# Patient Record
Sex: Male | Born: 1950 | Race: White | Hispanic: No | Marital: Married | Smoking: Current every day smoker
Health system: Southern US, Community
[De-identification: ages and names within clinical notes are randomized; demographics above are authoritative.]

---

## 2000-02-15 ENCOUNTER — Encounter: Admission: RE | Admit: 2000-02-15 | Discharge: 2000-02-15 | Payer: Self-pay | Admitting: Rheumatology

## 2000-02-15 ENCOUNTER — Encounter: Payer: Self-pay | Admitting: Rheumatology

## 2003-02-17 ENCOUNTER — Encounter: Admission: RE | Admit: 2003-02-17 | Discharge: 2003-02-17 | Payer: Self-pay | Admitting: Rheumatology

## 2003-02-27 ENCOUNTER — Encounter: Admission: RE | Admit: 2003-02-27 | Discharge: 2003-02-27 | Payer: Self-pay | Admitting: Rheumatology

## 2003-06-15 ENCOUNTER — Encounter: Admission: RE | Admit: 2003-06-15 | Discharge: 2003-06-15 | Payer: Self-pay | Admitting: Rheumatology

## 2003-06-29 ENCOUNTER — Encounter: Admission: RE | Admit: 2003-06-29 | Discharge: 2003-06-29 | Payer: Self-pay | Admitting: Rheumatology

## 2003-07-20 ENCOUNTER — Encounter: Admission: RE | Admit: 2003-07-20 | Discharge: 2003-07-20 | Payer: Self-pay | Admitting: Rheumatology

## 2006-04-23 ENCOUNTER — Encounter: Admission: RE | Admit: 2006-04-23 | Discharge: 2006-04-23 | Payer: Self-pay | Admitting: Rheumatology

## 2015-10-25 ENCOUNTER — Other Ambulatory Visit: Payer: Self-pay | Admitting: Rheumatology

## 2015-10-25 ENCOUNTER — Ambulatory Visit
Admission: RE | Admit: 2015-10-25 | Discharge: 2015-10-25 | Disposition: A | Discharge status: Home / Self Care | Payer: 59 | Source: Ambulatory Visit | Attending: Rheumatology | Admitting: Rheumatology

## 2015-10-25 DIAGNOSIS — R1032 Left lower quadrant pain: Secondary | ICD-10-CM

## 2017-04-03 DIAGNOSIS — M1612 Unilateral primary osteoarthritis, left hip: Secondary | ICD-10-CM | POA: Insufficient documentation

## 2017-04-03 DIAGNOSIS — M459 Ankylosing spondylitis of unspecified sites in spine: Secondary | ICD-10-CM | POA: Insufficient documentation

## 2017-04-03 DIAGNOSIS — E785 Hyperlipidemia, unspecified: Secondary | ICD-10-CM | POA: Insufficient documentation

## 2018-10-17 ENCOUNTER — Encounter (HOSPITAL_COMMUNITY): Payer: Self-pay

## 2018-10-17 ENCOUNTER — Inpatient Hospital Stay (HOSPITAL_COMMUNITY)
Admission: EM | Admit: 2018-10-17 | Discharge: 2018-10-23 | DRG: 455 | Disposition: A | Discharge status: Home Health | Payer: Medicare Other | Attending: Neurological Surgery | Admitting: Neurological Surgery

## 2018-10-17 ENCOUNTER — Other Ambulatory Visit: Payer: Self-pay

## 2018-10-17 ENCOUNTER — Emergency Department (HOSPITAL_COMMUNITY): Payer: Medicare Other

## 2018-10-17 DIAGNOSIS — M542 Cervicalgia: Secondary | ICD-10-CM

## 2018-10-17 DIAGNOSIS — W11XXXA Fall on and from ladder, initial encounter: Secondary | ICD-10-CM | POA: Diagnosis present

## 2018-10-17 DIAGNOSIS — S12590A Other displaced fracture of sixth cervical vertebra, initial encounter for closed fracture: Secondary | ICD-10-CM | POA: Diagnosis not present

## 2018-10-17 DIAGNOSIS — M452 Ankylosing spondylitis of cervical region: Secondary | ICD-10-CM | POA: Diagnosis present

## 2018-10-17 DIAGNOSIS — R1310 Dysphagia, unspecified: Secondary | ICD-10-CM | POA: Diagnosis not present

## 2018-10-17 DIAGNOSIS — Z20828 Contact with and (suspected) exposure to other viral communicable diseases: Secondary | ICD-10-CM | POA: Diagnosis present

## 2018-10-17 DIAGNOSIS — R292 Abnormal reflex: Secondary | ICD-10-CM | POA: Diagnosis present

## 2018-10-17 DIAGNOSIS — Z419 Encounter for procedure for purposes other than remedying health state, unspecified: Secondary | ICD-10-CM

## 2018-10-17 DIAGNOSIS — Z7951 Long term (current) use of inhaled steroids: Secondary | ICD-10-CM

## 2018-10-17 DIAGNOSIS — Z79899 Other long term (current) drug therapy: Secondary | ICD-10-CM

## 2018-10-17 DIAGNOSIS — S129XXA Fracture of neck, unspecified, initial encounter: Secondary | ICD-10-CM | POA: Diagnosis present

## 2018-10-17 DIAGNOSIS — F17210 Nicotine dependence, cigarettes, uncomplicated: Secondary | ICD-10-CM | POA: Diagnosis present

## 2018-10-17 LAB — CBC WITH DIFFERENTIAL/PLATELET
Abs Immature Granulocytes: 0.08 10*3/uL — ABNORMAL HIGH (ref 0.00–0.07)
Basophils Absolute: 0 10*3/uL (ref 0.0–0.1)
Basophils Relative: 0 %
Eosinophils Absolute: 0 10*3/uL (ref 0.0–0.5)
Eosinophils Relative: 0 %
HCT: 43.5 % (ref 39.0–52.0)
Hemoglobin: 14.7 g/dL (ref 13.0–17.0)
Immature Granulocytes: 1 %
Lymphocytes Relative: 8 %
Lymphs Abs: 0.9 10*3/uL (ref 0.7–4.0)
MCH: 29.6 pg (ref 26.0–34.0)
MCHC: 33.8 g/dL (ref 30.0–36.0)
MCV: 87.5 fL (ref 80.0–100.0)
Monocytes Absolute: 0.9 10*3/uL (ref 0.1–1.0)
Monocytes Relative: 8 %
Neutro Abs: 9.2 10*3/uL — ABNORMAL HIGH (ref 1.7–7.7)
Neutrophils Relative %: 83 %
Platelets: 198 10*3/uL (ref 150–400)
RBC: 4.97 MIL/uL (ref 4.22–5.81)
RDW: 13.1 % (ref 11.5–15.5)
WBC: 11.1 10*3/uL — ABNORMAL HIGH (ref 4.0–10.5)
nRBC: 0 % (ref 0.0–0.2)

## 2018-10-17 LAB — BASIC METABOLIC PANEL
Anion gap: 9 (ref 5–15)
BUN: 15 mg/dL (ref 8–23)
CO2: 26 mmol/L (ref 22–32)
Calcium: 9.3 mg/dL (ref 8.9–10.3)
Chloride: 101 mmol/L (ref 98–111)
Creatinine, Ser: 0.81 mg/dL (ref 0.61–1.24)
GFR calc Af Amer: >60 mL/min (ref >60)
GFR calc non Af Amer: >60 mL/min (ref >60)
Glucose, Bld: 107 mg/dL — ABNORMAL HIGH (ref 70–99)
Potassium: 4 mmol/L (ref 3.5–5.1)
Sodium: 136 mmol/L (ref 135–145)

## 2018-10-17 MED ORDER — HYDROMORPHONE HCL 1 MG/ML IJ SOLN
0.5000 mg | Freq: Once | INTRAMUSCULAR | Status: AC
  Administered 2018-10-17: 0.5 mg via INTRAVENOUS
  Filled 2018-10-17: qty 1

## 2018-10-17 MED ORDER — MORPHINE SULFATE (PF) 4 MG/ML IV SOLN
4.0000 mg | Freq: Once | INTRAVENOUS | Status: AC
  Administered 2018-10-17: 16:00:00 4 mg via INTRAVENOUS
  Filled 2018-10-17: qty 1

## 2018-10-17 MED ORDER — ONDANSETRON HCL 4 MG/2ML IJ SOLN
4.0000 mg | Freq: Once | INTRAMUSCULAR | Status: AC
  Administered 2018-10-17: 4 mg via INTRAVENOUS
  Filled 2018-10-17: qty 2

## 2018-10-17 MED ORDER — LORAZEPAM 2 MG/ML IJ SOLN
0.5000 mg | Freq: Once | INTRAMUSCULAR | Status: AC
  Administered 2018-10-17: 0.5 mg via INTRAVENOUS
  Filled 2018-10-17: qty 1

## 2018-10-17 NOTE — ED Notes (Signed)
Pt refused MRI, pt offered medication to help relax

## 2018-10-17 NOTE — ED Triage Notes (Signed)
Pt reports he had a fall off of a ladder on Tuesday and was seen in an ED that day. Per patient since then he has had increased pain in his right shoulder/back and has had difficulty walking. Pt reports he can barely get himself to the bathroom. Pt reports he does have a pmh of arthritis.

## 2018-10-17 NOTE — ED Notes (Addendum)
Called MRI to see when pt would be able to obtain scan. Wait time of an hour to an hour and a half. Pt informed of delay

## 2018-10-17 NOTE — ED Notes (Signed)
Pt to MRI at this time.

## 2018-10-17 NOTE — ED Provider Notes (Addendum)
[facility]   CSN: [id number] Arrival date & time: 10/17/18  1449    History   Chief Complaint Chief Complaint  Patient presents with   Fall    HPI Travis Hart is a 68 y.o. male.     68 yo M with a chief complaint of right-sided neck pain.  Patient fell off a ladder about a week ago.  Was seen had a trauma center at a different hospital.  Had a CT scan of the head and the neck that was unremarkable and they recommended an MRI which the patient declined and left.  Since then the pain has gotten much worse.  He has been having trouble getting up and moving around secondary to the pain.  Denies numbness or weakness to the upper or lower extremities.  He has had some bilateral paresthesias to bilateral upper extremities.  Denies chest pain denies shortness of breath denies abdominal pain.  The history is provided by the patient and medical records.  Fall This is a new problem. The current episode started more than 2 days ago. The problem occurs constantly. The problem has been gradually worsening. Pertinent negatives include no chest pain, no abdominal pain, no headaches and no shortness of breath. Nothing aggravates the symptoms. Nothing relieves the symptoms. He has tried nothing for the symptoms. The treatment provided no relief.    History reviewed. No pertinent past medical history.  Patient Active Problem List   Diagnosis Date Noted   Closed cervical spine fracture (HCC) 10/18/2018    Past Surgical History:  Procedure Laterality Date   ANTERIOR CERVICAL DECOMP/DISCECTOMY FUSION N/A 10/18/2018   Procedure: CERVICAL CORPECTOMY CERVICAL SIX;  Surgeon: Jadene Pierinistergard, Thomas A, MD;  Location: [facility] OR;  Service: Neurosurgery;  Laterality: N/A;   POSTERIOR CERVICAL FUSION/FORAMINOTOMY N/A 10/18/2018   Procedure: POSTERIOR CERVICAL FUSION/FORAMINOTOMY  CERVICAL FOUR-THORACIC ONE;  Surgeon: Jadene Pierinistergard, Thomas A, MD;  Location: [facility] OR;   Service: Neurosurgery;  Laterality: N/A;        Home Medications    Prior to Admission medications   Medication Sig Start Date End Date Taking? Authorizing Provider  acetaminophen (TYLENOL) 500 MG tablet Take 1,000 mg by mouth every 6 (six) hours as needed for mild pain.   Yes [provider]  ENBREL 25 MG injection Inject 25 mg into the skin 2 (two) times a week. Monday, Thursday 09/29/18  Yes [provider]  nabumetone (RELAFEN) 750 MG tablet Take 750 mg by mouth 2 (two) times daily. 09/30/18  Yes [provider]  predniSONE (DELTASONE) 20 MG tablet Take 10-20 mg by mouth See admin instructions. Take 1/2 tablet (10mg ) to 1 tablet (20mg ) every day for arthritis flare up 09/15/18  Yes [provider]  simvastatin (ZOCOR) 40 MG tablet Take 40 mg by mouth daily. 09/30/18  Yes [provider]  SPIRIVA HANDIHALER 18 MCG inhalation capsule Place 18 mcg into inhaler and inhale daily as needed for shortness of breath. 08/19/18  Yes [provider]    Family History History reviewed. No pertinent family history.  Social History Social History   Tobacco Use   Smoking status: Current Every Day Smoker   Smokeless tobacco: Never Used  Substance Use Topics   Alcohol use: Not on file   Drug use: Not on file     Allergies   Patient has no known allergies.   Review of Systems Review of Systems  Constitutional: Negative for chills and fever.  HENT:  Negative for congestion and facial swelling.   Eyes: Negative for discharge and visual disturbance.  Respiratory: Negative for shortness of breath.   Cardiovascular: Negative for chest pain and palpitations.  Gastrointestinal: Negative for abdominal pain, diarrhea and vomiting.  Musculoskeletal: Positive for arthralgias, myalgias and neck pain.  Skin: Negative for color change and rash.  Neurological: Negative for tremors, syncope and headaches.  Psychiatric/Behavioral: Negative for  confusion and dysphoric mood.     Physical Exam Updated Vital Signs BP 135/61 (BP Location: Left Arm)    Pulse 72    Temp (!) 97.5 F (36.4 C) (Oral)    Resp 16    Ht 5\' 6"  (1.676 m) Comment: per pt   Wt 90.7 kg Comment: per pt   SpO2 97%    BMI 32.28 kg/m   Physical Exam Vitals signs and nursing note reviewed.  Constitutional:      Appearance: He is well-developed.  HENT:     Head: Normocephalic and atraumatic.     Comments: Aspen collar in place.  No appreciable midline spinal tenderness.  Eyes:     Pupils: Pupils are equal, round, and reactive to light.  Neck:     Musculoskeletal: Normal range of motion and neck supple.     Vascular: No JVD.  Cardiovascular:     Rate and Rhythm: Normal rate and regular rhythm.     Heart sounds: No murmur. No friction rub. No gallop.   Pulmonary:     Effort: No respiratory distress.     Breath sounds: No wheezing.  Abdominal:     General: There is no distension.     Tenderness: There is no guarding or rebound.  Musculoskeletal: Normal range of motion.     Comments: Pulse motor and sensation intact to all 4 extremities.  Patient has 4-5 muscle strength to the right upper extremity compared to left.  4 out of 5 to the right lower extremity compared to left.  No reflexes to the right lower extremity.  Hyperreflexive to the left lower extremity.  2 beats of clonus to the left lower extremity.  Skin:    Coloration: Skin is not pale.     Findings: No rash.  Neurological:     Mental Status: He is alert and oriented to person, place, and time.  Psychiatric:        Behavior: Behavior normal.      ED Treatments / Results  Labs (all labs ordered are listed, but only abnormal results are displayed) Labs Reviewed  CBC WITH DIFFERENTIAL/PLATELET - Abnormal; Notable for the following components:      Result Value   WBC 11.1 (*)    Neutro Abs 9.2 (*)    Abs Immature Granulocytes 0.08 (*)    All other components within normal limits  BASIC  METABOLIC PANEL - Abnormal; Notable for the following components:   Glucose, Bld 107 (*)    All other components within normal limits  CBC - Abnormal; Notable for the following components:   WBC 12.3 (*)    RBC 4.12 (*)    Hemoglobin 12.1 (*)    HCT 36.2 (*)    All other components within normal limits  GLUCOSE, CAPILLARY - Abnormal; Notable for the following components:   Glucose-Capillary 104 (*)    All other components within normal limits  POCT I-STAT 4, (NA,K, GLUC, HGB,HCT) - Abnormal; Notable for the following components:   Glucose, Bld 130 (*)    HCT 37.0 (*)    Hemoglobin  12.6 (*)    All other components within normal limits  SARS CORONAVIRUS 2 (HOSPITAL ORDER, PERFORMED IN [facility] LAB)  CREATININE, SERUM  HEMOGLOBIN A1C    EKG None  Radiology Dg Swallowing Func-speech Pathology  Result Date: 10/21/2018 Objective Swallowing Evaluation: Type of Study: MBS-Modified Barium Swallow Study  Patient Details Name: AVANISH CERULLO MRN: [id number] Date of Birth: [birth year] Today's Date: 10/21/2018 Time: SLP Start Time (ACUTE ONLY): 0865 -SLP Stop Time (ACUTE ONLY): 0948 SLP Time Calculation (min) (ACUTE ONLY): 20 min Past Medical History: No past medical history on file. Past Surgical History: Past Surgical History: Procedure Laterality Date  ANTERIOR CERVICAL DECOMP/DISCECTOMY FUSION N/A 10/18/2018  Procedure: CERVICAL CORPECTOMY CERVICAL SIX;  Surgeon: Jadene Pierini, MD;  Location: [facility] OR;  Service: Neurosurgery;  Laterality: N/A;  POSTERIOR CERVICAL FUSION/FORAMINOTOMY N/A 10/18/2018  Procedure: POSTERIOR CERVICAL FUSION/FORAMINOTOMY  CERVICAL FOUR-THORACIC ONE;  Surgeon: Jadene Pierini, MD;  Location: [facility] OR;  Service: Neurosurgery;  Laterality: N/A; HPI:  68 y.o. man s/p fall with neck pain and symptoms of instability with hand and numbness. CT C-spine confirmed fracture line through C6 vertebral body into the C5-6 disc space with fracture of the C6-7 posterior  elements. Pt s/p C5-6 ACDF followed by C4-T1 posterior instrumented fusion on 7/5. PMH: arthritis, ankylosing spondylitis, freq hip dislocations.  Subjective: pt isn't sure if his swallowing is any different today Assessment / Plan / Recommendation CHL IP CLINICAL IMPRESSIONS 10/21/2018 Clinical Impression Pt has a mild-moderate pharyngeal dysphagia from what appears to be edema from recent surgery that results in protrusion of the posterior pharyngeal wall, limiting epiglottic inversion. With incomplete airway closure, pt does have consistent entrance into the laryngeal vestibule regardless of consistency attempted; however, thin liquids penetrate transiently, clearing either upon completion of the swallow or as he performs subsequent swallows. There was a single incidence of aspiration with a larger sip, but it was trace and pt had an immediate cough response that ejected it out of the airway and then up and out his oral cavity. Penetration with solids does not clear quite as promptly, although it also does not reach the vocal folds. He has increased vallecular residue as boluses become more solid. Given potential limitations in positioning when pt is eating meals, recommend starting with a full liquid diet today, using small, single boluses to reduce risk of aspiration. Instructed pt to monitor for any coughing, which is likely a sign that he is either getting too large of sips or that he is not upright enough. Anticiapte good prognosis for advancement to more solids with increased time. SLP Visit Diagnosis Dysphagia, pharyngeal phase (R13.13) Attention and concentration deficit following -- Frontal lobe and executive function deficit following -- Impact on safety and function Mild aspiration risk;Moderate aspiration risk   CHL IP TREATMENT RECOMMENDATION 10/21/2018 Treatment Recommendations Therapy as outlined in treatment plan below   Prognosis 10/21/2018 Prognosis for Safe Diet Advancement Good Barriers to Reach  Goals -- Barriers/Prognosis Comment -- CHL IP DIET RECOMMENDATION 10/21/2018 SLP Diet Recommendations Thin liquid Liquid Administration via Cup;Straw Medication Administration Crushed with puree Compensations Slow rate;Small sips/bites Postural Changes Seated upright at 90 degrees   CHL IP OTHER RECOMMENDATIONS 10/21/2018 Recommended Consults -- Oral Care Recommendations Oral care BID Other Recommendations Have oral suction available   CHL IP FOLLOW UP RECOMMENDATIONS 10/21/2018 Follow up Recommendations Inpatient Rehab   CHL IP FREQUENCY AND DURATION 10/21/2018 Speech Therapy Frequency (ACUTE ONLY) min 2x/week Treatment Duration 2 weeks  CHL IP ORAL PHASE 10/21/2018 Oral Phase WFL Oral - Pudding Teaspoon -- Oral - Pudding Cup -- Oral - Honey Teaspoon -- Oral - Honey Cup -- Oral - Nectar Teaspoon -- Oral - Nectar Cup -- Oral - Nectar Straw -- Oral - Thin Teaspoon -- Oral - Thin Cup -- Oral - Thin Straw -- Oral - Puree -- Oral - Mech Soft -- Oral - Regular -- Oral - Multi-Consistency -- Oral - Pill -- Oral Phase - Comment --  CHL IP PHARYNGEAL PHASE 10/21/2018 Pharyngeal Phase Impaired Pharyngeal- Pudding Teaspoon -- Pharyngeal -- Pharyngeal- Pudding Cup -- Pharyngeal -- Pharyngeal- Honey Teaspoon -- Pharyngeal -- Pharyngeal- Honey Cup -- Pharyngeal -- Pharyngeal- Nectar Teaspoon -- Pharyngeal -- Pharyngeal- Nectar Cup -- Pharyngeal -- Pharyngeal- Nectar Straw -- Pharyngeal -- Pharyngeal- Thin Teaspoon Reduced epiglottic inversion;Reduced anterior laryngeal mobility;Reduced laryngeal elevation;Reduced pharyngeal peristalsis;Pharyngeal residue - valleculae Pharyngeal -- Pharyngeal- Thin Cup Reduced epiglottic inversion;Reduced anterior laryngeal mobility;Reduced laryngeal elevation;Reduced airway/laryngeal closure;Penetration/Aspiration before swallow Pharyngeal Material enters airway, remains ABOVE vocal cords then ejected out Pharyngeal- Thin Straw Reduced epiglottic inversion;Reduced anterior laryngeal mobility;Reduced  laryngeal elevation;Reduced airway/laryngeal closure;Penetration/Aspiration before swallow Pharyngeal Material enters airway, passes BELOW cords then ejected out Pharyngeal- Puree Reduced epiglottic inversion;Reduced anterior laryngeal mobility;Reduced laryngeal elevation;Reduced airway/laryngeal closure;Penetration/Aspiration during swallow;Pharyngeal residue - valleculae Pharyngeal Material enters airway, remains ABOVE vocal cords and not ejected out Pharyngeal- Mechanical Soft Reduced epiglottic inversion;Reduced anterior laryngeal mobility;Reduced laryngeal elevation;Reduced airway/laryngeal closure;Penetration/Aspiration during swallow;Pharyngeal residue - valleculae Pharyngeal Material enters airway, remains ABOVE vocal cords and not ejected out Pharyngeal- Regular -- Pharyngeal -- Pharyngeal- Multi-consistency -- Pharyngeal -- Pharyngeal- Pill -- Pharyngeal -- Pharyngeal Comment --  CHL IP CERVICAL ESOPHAGEAL PHASE 10/21/2018 Cervical Esophageal Phase Impaired Pudding Teaspoon -- Pudding Cup -- Honey Teaspoon -- Honey Cup -- Nectar Teaspoon -- Nectar Cup -- Nectar Straw -- Thin Teaspoon Reduced cricopharyngeal relaxation Thin Cup Reduced cricopharyngeal relaxation Thin Straw Reduced cricopharyngeal relaxation Puree Reduced cricopharyngeal relaxation Mechanical Soft Reduced cricopharyngeal relaxation Regular -- Multi-consistency -- Pill -- Cervical Esophageal Comment -- Virl Axe Nix 10/21/2018, 10:49 AM  Ivar Drape, M.A. CCC-SLP Acute Rehabilitation Services Pager [phone] Office [phone]              Procedures Procedures (including critical care time)  Medications Ordered in ED Medications  lactated ringers infusion ( Intravenous Anesthesia Volume Adjustment 10/18/18 1811)  ceFAZolin (ANCEF) 2-4 GM/100ML-% IVPB (has no administration in time range)  HYDROmorphone (DILAUDID) 1 MG/ML injection (has no administration in time range)  heparin injection 5,000 Units (5,000 Units Subcutaneous Given  10/21/18 1355)  ceFAZolin (ANCEF) IVPB 2g/100 mL premix (2 g Intravenous Not Given 10/19/18 1039)  acetaminophen (TYLENOL) tablet 650 mg (has no administration in time range)    Or  acetaminophen (TYLENOL) suppository 650 mg (has no administration in time range)  HYDROmorphone (DILAUDID) injection 0.5 mg (0.5 mg Intravenous Given 10/21/18 0326)  docusate sodium (COLACE) capsule 100 mg (100 mg Oral Not Given 10/21/18 1017)  polyethylene glycol (MIRALAX / GLYCOLAX) packet 17 g (17 g Oral Given 10/19/18 2142)  ondansetron (ZOFRAN) tablet 4 mg (has no administration in time range)    Or  ondansetron (ZOFRAN) injection 4 mg (has no administration in time range)  promethazine (PHENERGAN) tablet 12.5-25 mg (has no administration in time range)  oxyCODONE (Oxy IR/ROXICODONE) immediate release tablet 5 mg (5 mg Oral Given 10/20/18 0821)  oxyCODONE (Oxy IR/ROXICODONE) immediate release tablet 10 mg (10 mg Oral Given 10/20/18 0347)  0.9 %  sodium chloride infusion ( Intravenous  New Bag/Given 10/21/18 0649)  MEDLINE mouth rinse (15 mLs Mouth Rinse Given 10/21/18 1017)  dexamethasone (DECADRON) injection 4 mg (4 mg Intravenous Not Given 10/21/18 1016)  insulin aspart (novoLOG) injection 0-9 Units (0 Units Subcutaneous Not Given 10/21/18 1159)  famotidine (PEPCID) IVPB 20 mg premix (20 mg Intravenous New Bag/Given 10/21/18 1015)  morphine 4 MG/ML injection 4 mg (4 mg Intravenous Given 10/17/18 1606)  ondansetron (ZOFRAN) injection 4 mg (4 mg Intravenous Given 10/17/18 1605)  HYDROmorphone (DILAUDID) injection 0.5 mg (0.5 mg Intravenous Given 10/17/18 2106)  LORazepam (ATIVAN) injection 0.5 mg (0.5 mg Intravenous Given 10/17/18 2341)  LORazepam (ATIVAN) injection 1 mg (1 mg Intravenous Given 10/18/18 0525)  phenylephrine 0.4-0.9 MG/10ML-% injection (has no administration in time range)  ondansetron (ZOFRAN) 4 MG/2ML injection (has no administration in time range)  propofol (DIPRIVAN) 10 mg/mL bolus/IV push (has no administration in time  range)  midazolam (VERSED) 2 MG/2ML injection (has no administration in time range)  fentaNYL (SUBLIMAZE) 250 MCG/5ML injection (has no administration in time range)  lidocaine-EPINEPHrine (XYLOCAINE W/EPI) 1 %-1:100000 (with pres) injection (has no administration in time range)  thrombin 5000 units spray (has no administration in time range)  lidocaine 20 MG/ML injection (has no administration in time range)  rocuronium bromide 100 MG/10ML SOSY (has no administration in time range)  ePHEDrine 5 MG/ML injection (has no administration in time range)  dexamethasone (DECADRON) 10 MG/ML injection (has no administration in time range)  labetalol (NORMODYNE) 5 MG/ML injection (has no administration in time range)  diphenhydrAMINE (BENADRYL) 50 MG/ML injection (has no administration in time range)  lidocaine-EPINEPHrine (XYLOCAINE W/EPI) 1 %-1:100000 (with pres) injection (has no administration in time range)  thrombin 5000 units spray (has no administration in time range)  rocuronium bromide 100 MG/10ML SOSY (has no administration in time range)  ceFAZolin (ANCEF) 1 g injection (has no administration in time range)  fentaNYL (SUBLIMAZE) 250 MCG/5ML injection (has no administration in time range)  acetaminophen (OFIRMEV) 10 MG/ML IV (has no administration in time range)  succinylcholine (ANECTINE) 200 MG/10ML syringe (has no administration in time range)  lidocaine 20 MG/ML injection (has no administration in time range)  rocuronium bromide 100 MG/10ML SOSY (has no administration in time range)  ePHEDrine 5 MG/ML injection (has no administration in time range)  phenylephrine 0.4-0.9 MG/10ML-% injection (has no administration in time range)  artificial tears (LACRILUBE) ophthalmic ointment (has no administration in time range)     Initial Impression / Assessment and Plan / ED Course  I have reviewed the triage vital signs and the nursing notes.  Pertinent labs & imaging results that were  available during my care of the patient were reviewed by me and considered in my medical decision making (see chart for details).  Clinical Course as of Oct 20 1513  Sat Oct 17, 2018  2230 Plan: Patient pending MRI of the cervical and thoracic spine.  He will need Ativan for this.  Plan for consult with neurosurgery and/or neurology afterwards.   [HM]    Clinical Course User Index [HM] Muthersbaugh, Jarrett Soho, PA-C       68 yo M with a chief complaints of a fall.  This was from a ladder and was about a week ago.  He was seen at a trauma center and had CT imaging of his head and neck.  At that time in the documentation it was noted that they recommended an MRI though I am unsure of the indication at that time.  Here  the patient has some neurologic findings that are concerning for spinal cord compression.  Will obtain an MRI of the C and T-spine.  Signed out to Aviva KluverAlyssa Murray , PA-C please see her note for further details of care in the ED.   The patients results and plan were reviewed and discussed.   Any x-rays performed were independently reviewed by myself.   Differential diagnosis were considered with the presenting HPI.  Medications  lactated ringers infusion ( Intravenous Anesthesia Volume Adjustment 10/18/18 1811)  ceFAZolin (ANCEF) 2-4 GM/100ML-% IVPB (has no administration in time range)  HYDROmorphone (DILAUDID) 1 MG/ML injection (has no administration in time range)  heparin injection 5,000 Units (5,000 Units Subcutaneous Given 10/21/18 1355)  ceFAZolin (ANCEF) IVPB 2g/100 mL premix (2 g Intravenous Not Given 10/19/18 1039)  acetaminophen (TYLENOL) tablet 650 mg (has no administration in time range)    Or  acetaminophen (TYLENOL) suppository 650 mg (has no administration in time range)  HYDROmorphone (DILAUDID) injection 0.5 mg (0.5 mg Intravenous Given 10/21/18 0326)  docusate sodium (COLACE) capsule 100 mg (100 mg Oral Not Given 10/21/18 1017)  polyethylene glycol (MIRALAX / GLYCOLAX)  packet 17 g (17 g Oral Given 10/19/18 2142)  ondansetron (ZOFRAN) tablet 4 mg (has no administration in time range)    Or  ondansetron (ZOFRAN) injection 4 mg (has no administration in time range)  promethazine (PHENERGAN) tablet 12.5-25 mg (has no administration in time range)  oxyCODONE (Oxy IR/ROXICODONE) immediate release tablet 5 mg (5 mg Oral Given 10/20/18 0821)  oxyCODONE (Oxy IR/ROXICODONE) immediate release tablet 10 mg (10 mg Oral Given 10/20/18 0347)  0.9 %  sodium chloride infusion ( Intravenous New Bag/Given 10/21/18 0649)  MEDLINE mouth rinse (15 mLs Mouth Rinse Given 10/21/18 1017)  dexamethasone (DECADRON) injection 4 mg (4 mg Intravenous Not Given 10/21/18 1016)  insulin aspart (novoLOG) injection 0-9 Units (0 Units Subcutaneous Not Given 10/21/18 1159)  famotidine (PEPCID) IVPB 20 mg premix (20 mg Intravenous New Bag/Given 10/21/18 1015)  morphine 4 MG/ML injection 4 mg (4 mg Intravenous Given 10/17/18 1606)  ondansetron (ZOFRAN) injection 4 mg (4 mg Intravenous Given 10/17/18 1605)  HYDROmorphone (DILAUDID) injection 0.5 mg (0.5 mg Intravenous Given 10/17/18 2106)  LORazepam (ATIVAN) injection 0.5 mg (0.5 mg Intravenous Given 10/17/18 2341)  LORazepam (ATIVAN) injection 1 mg (1 mg Intravenous Given 10/18/18 0525)  phenylephrine 0.4-0.9 MG/10ML-% injection (has no administration in time range)  ondansetron (ZOFRAN) 4 MG/2ML injection (has no administration in time range)  propofol (DIPRIVAN) 10 mg/mL bolus/IV push (has no administration in time range)  midazolam (VERSED) 2 MG/2ML injection (has no administration in time range)  fentaNYL (SUBLIMAZE) 250 MCG/5ML injection (has no administration in time range)  lidocaine-EPINEPHrine (XYLOCAINE W/EPI) 1 %-1:100000 (with pres) injection (has no administration in time range)  thrombin 5000 units spray (has no administration in time range)  lidocaine 20 MG/ML injection (has no administration in time range)  rocuronium bromide 100 MG/10ML SOSY (has no  administration in time range)  ePHEDrine 5 MG/ML injection (has no administration in time range)  dexamethasone (DECADRON) 10 MG/ML injection (has no administration in time range)  labetalol (NORMODYNE) 5 MG/ML injection (has no administration in time range)  diphenhydrAMINE (BENADRYL) 50 MG/ML injection (has no administration in time range)  lidocaine-EPINEPHrine (XYLOCAINE W/EPI) 1 %-1:100000 (with pres) injection (has no administration in time range)  thrombin 5000 units spray (has no administration in time range)  rocuronium bromide 100 MG/10ML SOSY (has no administration in time range)  ceFAZolin (ANCEF)  1 g injection (has no administration in time range)  fentaNYL (SUBLIMAZE) 250 MCG/5ML injection (has no administration in time range)  acetaminophen (OFIRMEV) 10 MG/ML IV (has no administration in time range)  succinylcholine (ANECTINE) 200 MG/10ML syringe (has no administration in time range)  lidocaine 20 MG/ML injection (has no administration in time range)  rocuronium bromide 100 MG/10ML SOSY (has no administration in time range)  ePHEDrine 5 MG/ML injection (has no administration in time range)  phenylephrine 0.4-0.9 MG/10ML-% injection (has no administration in time range)  artificial tears (LACRILUBE) ophthalmic ointment (has no administration in time range)    Vitals:   10/20/18 2314 10/21/18 0327 10/21/18 0804 10/21/18 1126  BP: 127/61 (!) 143/64 132/63 135/61  Pulse: 77 77 72 72  Resp: Temp: 98.3 F (36.8 C) 97.8 F (36.6 C) 98.2 F (36.8 C) (!) 97.5 F (36.4 C)  TempSrc: Oral Oral Oral Oral  SpO2: 95% 94% 95% 97%  Weight:      Height:        Final diagnoses:  Neck pain      Final Clinical Impressions(s) / ED Diagnoses   Final diagnoses:  Neck pain    ED Discharge Orders    None       Melene Plan, DO 10/17/18 1919    Melene Plan, DO 10/21/18 1515

## 2018-10-17 NOTE — ED Notes (Signed)
Pt very anxious when returned from MRI.  Unable to lay still.

## 2018-10-17 NOTE — Progress Notes (Signed)
Attempted MRI, patient could not complete due to cramps and anxiety. He worked the cramps out of his legs but then when put into machine he said he had to come out and he could not do this. We offered to call patient's nurse about possibly some medication to help and he said he did not want that right now and that he would like to just go back to his room

## 2018-10-17 NOTE — ED Provider Notes (Signed)
Physical Exam  BP 134/68   Pulse 63   Temp (!) 97.5 F (36.4 C) (Oral)   Ht 5' (1.524 m)   Wt 90.7 kg   SpO2 96%   BMI 39.06 kg/m   Assumed care from Dr. Deno Etienne at [phone]. Briefly, the patient is a 68 y.o. male with PMHx of  has no past medical history on file. here with neck and back pain. Please see Dr. Miachel Roux note for full H & P.  He was seen at [facility] in [place] on 10-13-2018 after a fall of approximately 10 feet off of a ladder.  He reported that after he fell he felt that his neck was unstable.  He had some numbness in the glans left form of the hand.  He was supposed to get MRI at [facility] and stay overnight, however he reports that due to multiple misunderstandings, he ended up requesting discharge.   Labs Reviewed  CBC WITH DIFFERENTIAL/PLATELET - Abnormal; Notable for the following components:      Result Value   WBC 11.1 (*)    Neutro Abs 9.2 (*)    Abs Immature Granulocytes 0.08 (*)    All other components within normal limits  BASIC METABOLIC PANEL - Abnormal; Notable for the following components:   Glucose, Bld 107 (*)    All other components within normal limits    Course of Care:   Physical Exam Vitals signs and nursing note reviewed.  Constitutional:      General: He is not in acute distress.    Appearance: He is well-developed. He is not diaphoretic.     Comments: Sitting comfortably in bed.  HENT:     Head: Normocephalic and atraumatic.  Eyes:     General:        Right eye: No discharge.        Left eye: No discharge.     Conjunctiva/sclera: Conjunctivae normal.     Comments: EOMs normal to gross examination.  Neck:     Comments: Aspen collar in place.  Cardiovascular:     Rate and Rhythm: Normal rate and regular rhythm.     Comments: Intact, 2+ radial pulse. Pulmonary:     Comments: Converses comfortably.  No audible wheeze or stridor. Abdominal:     General: There is no distension.  Musculoskeletal:     Comments: Strength symmetric in bilateral upper extremities.  He has difficult to illicit right brachial reflex and hyperreflexic left brachial reflex. He exhibits bilateral patellar hyperreflexia.   Skin:    General: Skin is warm and dry.  Neurological:     Mental Status: He is alert.     Comments: Cranial nerves intact to gross observation. Patient moves extremities without difficulty.  Psychiatric:        Behavior: Behavior normal.        Thought Content: Thought content normal.        Judgment: Judgment normal.     ED Course/Procedures     Procedures  MDM   This is a 68 year old male with a recent history of fall from 10 foot height with subsequent neck pain and concerning neurologic findings including asymmetric reflexes and hyperreflexia.  He is awaiting MRI cervical and thoracic spine.  Aspen collar remains in place.  Disposition and interventions pending findings on MRI.  Care signed out to [facility], PA-C at 11:07 PM to obtain disposition after MRI.        Albesa Seen,  PA-C 10/17/18 2308    Melene PlanFloyd, Dan, DO 11/02/18 1117

## 2018-10-17 NOTE — ED Notes (Signed)
Pt desating after pain med admin. Placed on 2L [place]. Now 95%

## 2018-10-18 ENCOUNTER — Emergency Department (HOSPITAL_COMMUNITY): Payer: Medicare Other | Admitting: Certified Registered Nurse Anesthetist

## 2018-10-18 ENCOUNTER — Encounter (HOSPITAL_COMMUNITY): Payer: Self-pay | Admitting: Certified Registered Nurse Anesthetist

## 2018-10-18 ENCOUNTER — Encounter (HOSPITAL_COMMUNITY)
Admission: EM | Disposition: A | Discharge status: Home Health | Payer: Self-pay | Source: Home / Self Care | Attending: Neurological Surgery

## 2018-10-18 ENCOUNTER — Emergency Department (HOSPITAL_COMMUNITY): Payer: Medicare Other

## 2018-10-18 DIAGNOSIS — W11XXXA Fall on and from ladder, initial encounter: Secondary | ICD-10-CM | POA: Diagnosis present

## 2018-10-18 DIAGNOSIS — R1310 Dysphagia, unspecified: Secondary | ICD-10-CM | POA: Diagnosis not present

## 2018-10-18 DIAGNOSIS — S129XXA Fracture of neck, unspecified, initial encounter: Secondary | ICD-10-CM | POA: Diagnosis present

## 2018-10-18 DIAGNOSIS — Z20828 Contact with and (suspected) exposure to other viral communicable diseases: Secondary | ICD-10-CM | POA: Diagnosis present

## 2018-10-18 DIAGNOSIS — Z79899 Other long term (current) drug therapy: Secondary | ICD-10-CM | POA: Diagnosis not present

## 2018-10-18 DIAGNOSIS — Z7951 Long term (current) use of inhaled steroids: Secondary | ICD-10-CM | POA: Diagnosis not present

## 2018-10-18 DIAGNOSIS — M542 Cervicalgia: Secondary | ICD-10-CM | POA: Diagnosis present

## 2018-10-18 DIAGNOSIS — R292 Abnormal reflex: Secondary | ICD-10-CM | POA: Diagnosis present

## 2018-10-18 DIAGNOSIS — F17210 Nicotine dependence, cigarettes, uncomplicated: Secondary | ICD-10-CM | POA: Diagnosis present

## 2018-10-18 DIAGNOSIS — M452 Ankylosing spondylitis of cervical region: Secondary | ICD-10-CM | POA: Diagnosis present

## 2018-10-18 DIAGNOSIS — S12590A Other displaced fracture of sixth cervical vertebra, initial encounter for closed fracture: Secondary | ICD-10-CM | POA: Diagnosis present

## 2018-10-18 HISTORY — PX: POSTERIOR CERVICAL FUSION/FORAMINOTOMY: SHX5038

## 2018-10-18 HISTORY — PX: ANTERIOR CERVICAL DECOMP/DISCECTOMY FUSION: SHX1161

## 2018-10-18 LAB — CBC
HCT: 36.2 % — ABNORMAL LOW (ref 39.0–52.0)
Hemoglobin: 12.1 g/dL — ABNORMAL LOW (ref 13.0–17.0)
MCH: 29.4 pg (ref 26.0–34.0)
MCHC: 33.4 g/dL (ref 30.0–36.0)
MCV: 87.9 fL (ref 80.0–100.0)
Platelets: 198 10*3/uL (ref 150–400)
RBC: 4.12 MIL/uL — ABNORMAL LOW (ref 4.22–5.81)
RDW: 13 % (ref 11.5–15.5)
WBC: 12.3 10*3/uL — ABNORMAL HIGH (ref 4.0–10.5)
nRBC: 0 % (ref 0.0–0.2)

## 2018-10-18 LAB — POCT I-STAT 4, (NA,K, GLUC, HGB,HCT)
Glucose, Bld: 130 mg/dL — ABNORMAL HIGH (ref 70–99)
HCT: 37 % — ABNORMAL LOW (ref 39.0–52.0)
Hemoglobin: 12.6 g/dL — ABNORMAL LOW (ref 13.0–17.0)
Potassium: 4 mmol/L (ref 3.5–5.1)
Sodium: 137 mmol/L (ref 135–145)

## 2018-10-18 LAB — CREATININE, SERUM
Creatinine, Ser: 1.08 mg/dL (ref 0.61–1.24)
GFR calc Af Amer: >60 mL/min (ref >60)
GFR calc non Af Amer: >60 mL/min (ref >60)

## 2018-10-18 LAB — SARS CORONAVIRUS 2 BY RT PCR (HOSPITAL ORDER, PERFORMED IN ~~LOC~~ HOSPITAL LAB): SARS Coronavirus 2: NEGATIVE

## 2018-10-18 SURGERY — ANTERIOR CERVICAL DECOMPRESSION/DISCECTOMY FUSION 1 LEVEL
Anesthesia: General | Site: Spine Cervical

## 2018-10-18 MED ORDER — LABETALOL HCL 5 MG/ML IV SOLN
INTRAVENOUS | Status: AC
  Filled 2018-10-18: qty 4

## 2018-10-18 MED ORDER — DEXAMETHASONE SODIUM PHOSPHATE 10 MG/ML IJ SOLN
INTRAMUSCULAR | Status: DC | PRN
  Administered 2018-10-18: 10 mg via INTRAVENOUS

## 2018-10-18 MED ORDER — FENTANYL CITRATE (PF) 250 MCG/5ML IJ SOLN
INTRAMUSCULAR | Status: AC
  Filled 2018-10-18: qty 5

## 2018-10-18 MED ORDER — ROCURONIUM BROMIDE 50 MG/5ML IV SOSY
PREFILLED_SYRINGE | INTRAVENOUS | Status: DC | PRN
  Administered 2018-10-18: 50 mg via INTRAVENOUS
  Administered 2018-10-18: 15 mg via INTRAVENOUS
  Administered 2018-10-18: 50 mg via INTRAVENOUS
  Administered 2018-10-18: 10 mg via INTRAVENOUS
  Administered 2018-10-18: 100 mg via INTRAVENOUS
  Administered 2018-10-18: 25 mg via INTRAVENOUS

## 2018-10-18 MED ORDER — SUGAMMADEX SODIUM 200 MG/2ML IV SOLN
INTRAVENOUS | Status: DC | PRN
  Administered 2018-10-18: 300 mg via INTRAVENOUS

## 2018-10-18 MED ORDER — LIDOCAINE-EPINEPHRINE 1 %-1:100000 IJ SOLN
INTRAMUSCULAR | Status: AC
  Filled 2018-10-18: qty 1

## 2018-10-18 MED ORDER — THROMBIN 5000 UNITS EX SOLR
CUTANEOUS | Status: AC
  Filled 2018-10-18: qty 5000

## 2018-10-18 MED ORDER — CEFAZOLIN SODIUM 1 G IJ SOLR
INTRAMUSCULAR | Status: AC
  Filled 2018-10-18: qty 20

## 2018-10-18 MED ORDER — PROPOFOL 10 MG/ML IV BOLUS
INTRAVENOUS | Status: AC
  Filled 2018-10-18: qty 20

## 2018-10-18 MED ORDER — OXYCODONE HCL 5 MG PO TABS
5.0000 mg | ORAL_TABLET | ORAL | Status: DC | PRN
  Administered 2018-10-20: 5 mg via ORAL
  Filled 2018-10-18 (×2): qty 1

## 2018-10-18 MED ORDER — PROMETHAZINE HCL 25 MG PO TABS
12.5000 mg | ORAL_TABLET | ORAL | Status: DC | PRN
  Filled 2018-10-18: qty 1

## 2018-10-18 MED ORDER — LACTATED RINGERS IV SOLN
INTRAVENOUS | Status: DC | PRN
  Administered 2018-10-18 (×3): via INTRAVENOUS

## 2018-10-18 MED ORDER — LIDOCAINE-EPINEPHRINE 1 %-1:100000 IJ SOLN
INTRAMUSCULAR | Status: DC | PRN
  Administered 2018-10-18: 5 mL

## 2018-10-18 MED ORDER — DOCUSATE SODIUM 100 MG PO CAPS
100.0000 mg | ORAL_CAPSULE | Freq: Two times a day (BID) | ORAL | Status: DC
  Administered 2018-10-18 – 2018-10-23 (×8): 100 mg via ORAL
  Filled 2018-10-18 (×9): qty 1

## 2018-10-18 MED ORDER — HYDROMORPHONE HCL 1 MG/ML IJ SOLN
0.5000 mg | INTRAMUSCULAR | Status: DC | PRN
  Administered 2018-10-19 – 2018-10-21 (×7): 0.5 mg via INTRAVENOUS
  Filled 2018-10-18 (×7): qty 0.5

## 2018-10-18 MED ORDER — EPHEDRINE 5 MG/ML INJ
INTRAVENOUS | Status: AC
  Filled 2018-10-18: qty 10

## 2018-10-18 MED ORDER — HYDROMORPHONE HCL 1 MG/ML IJ SOLN
0.2500 mg | INTRAMUSCULAR | Status: DC | PRN
  Administered 2018-10-18: 0.5 mg via INTRAVENOUS

## 2018-10-18 MED ORDER — THROMBIN 5000 UNITS EX SOLR
OROMUCOSAL | Status: DC | PRN
  Administered 2018-10-18 (×2): 5 mL via TOPICAL

## 2018-10-18 MED ORDER — FENTANYL CITRATE (PF) 100 MCG/2ML IJ SOLN
INTRAMUSCULAR | Status: DC | PRN
  Administered 2018-10-18: 25 ug via INTRAVENOUS
  Administered 2018-10-18: 50 ug via INTRAVENOUS
  Administered 2018-10-18: 150 ug via INTRAVENOUS
  Administered 2018-10-18: 50 ug via INTRAVENOUS
  Administered 2018-10-18: 25 ug via INTRAVENOUS
  Administered 2018-10-18: 50 ug via INTRAVENOUS

## 2018-10-18 MED ORDER — ROCURONIUM BROMIDE 10 MG/ML (PF) SYRINGE
PREFILLED_SYRINGE | INTRAVENOUS | Status: AC
  Filled 2018-10-18: qty 10

## 2018-10-18 MED ORDER — FAMOTIDINE IN NACL 20-0.9 MG/50ML-% IV SOLN
20.0000 mg | Freq: Two times a day (BID) | INTRAVENOUS | Status: DC
  Administered 2018-10-18: 20 mg via INTRAVENOUS
  Filled 2018-10-18 (×2): qty 50

## 2018-10-18 MED ORDER — MIDAZOLAM HCL 2 MG/2ML IJ SOLN
INTRAMUSCULAR | Status: AC
  Filled 2018-10-18: qty 2

## 2018-10-18 MED ORDER — ACETAMINOPHEN 650 MG RE SUPP: 650.0000 mg | RECTAL | Status: DC | PRN

## 2018-10-18 MED ORDER — ROCURONIUM BROMIDE 10 MG/ML (PF) SYRINGE
PREFILLED_SYRINGE | INTRAVENOUS | Status: AC
  Filled 2018-10-18: qty 20

## 2018-10-18 MED ORDER — ROCURONIUM BROMIDE 10 MG/ML (PF) SYRINGE
PREFILLED_SYRINGE | INTRAVENOUS | Status: AC
  Filled 2018-10-18: qty 30

## 2018-10-18 MED ORDER — HEPARIN SODIUM (PORCINE) 5000 UNIT/ML IJ SOLN
5000.0000 [IU] | Freq: Three times a day (TID) | INTRAMUSCULAR | Status: DC
  Administered 2018-10-20 – 2018-10-23 (×10): 5000 [IU] via SUBCUTANEOUS
  Filled 2018-10-18 (×10): qty 1

## 2018-10-18 MED ORDER — ONDANSETRON HCL 4 MG/2ML IJ SOLN: 4.0000 mg | Freq: Once | INTRAMUSCULAR | Status: DC | PRN

## 2018-10-18 MED ORDER — ONDANSETRON HCL 4 MG/2ML IJ SOLN
INTRAMUSCULAR | Status: AC
  Filled 2018-10-18: qty 2

## 2018-10-18 MED ORDER — LIDOCAINE 2% (20 MG/ML) 5 ML SYRINGE
INTRAMUSCULAR | Status: AC
  Filled 2018-10-18: qty 10

## 2018-10-18 MED ORDER — EPHEDRINE SULFATE 50 MG/ML IJ SOLN
INTRAMUSCULAR | Status: DC | PRN
  Administered 2018-10-18: 5 mg via INTRAVENOUS
  Administered 2018-10-18: 10 mg via INTRAVENOUS

## 2018-10-18 MED ORDER — 0.9 % SODIUM CHLORIDE (POUR BTL) OPTIME
TOPICAL | Status: DC | PRN
  Administered 2018-10-18: 1000 mL

## 2018-10-18 MED ORDER — LIDOCAINE 2% (20 MG/ML) 5 ML SYRINGE
INTRAMUSCULAR | Status: AC
  Filled 2018-10-18: qty 5

## 2018-10-18 MED ORDER — SODIUM CHLORIDE 0.9 % IV SOLN
INTRAVENOUS | Status: DC | PRN
  Administered 2018-10-18: 500 mL

## 2018-10-18 MED ORDER — CEFAZOLIN SODIUM-DEXTROSE 2-3 GM-%(50ML) IV SOLR
INTRAVENOUS | Status: DC | PRN
  Administered 2018-10-18 (×2): 2 g via INTRAVENOUS

## 2018-10-18 MED ORDER — ONDANSETRON HCL 4 MG PO TABS: 4.0000 mg | ORAL_TABLET | ORAL | Status: DC | PRN

## 2018-10-18 MED ORDER — ACETAMINOPHEN 325 MG PO TABS: 650.0000 mg | ORAL_TABLET | ORAL | Status: DC | PRN

## 2018-10-18 MED ORDER — PHENYLEPHRINE 40 MCG/ML (10ML) SYRINGE FOR IV PUSH (FOR BLOOD PRESSURE SUPPORT)
PREFILLED_SYRINGE | INTRAVENOUS | Status: AC
  Filled 2018-10-18: qty 10

## 2018-10-18 MED ORDER — ACETAMINOPHEN 10 MG/ML IV SOLN
INTRAVENOUS | Status: AC
  Filled 2018-10-18: qty 100

## 2018-10-18 MED ORDER — ALBUMIN HUMAN 5 % IV SOLN
INTRAVENOUS | Status: DC | PRN
  Administered 2018-10-18 (×2): via INTRAVENOUS

## 2018-10-18 MED ORDER — ACETAMINOPHEN 10 MG/ML IV SOLN
INTRAVENOUS | Status: DC | PRN
  Administered 2018-10-18: 1000 mg via INTRAVENOUS

## 2018-10-18 MED ORDER — LABETALOL HCL 5 MG/ML IV SOLN: 10.0000 mg | INTRAVENOUS | Status: DC | PRN

## 2018-10-18 MED ORDER — LIDOCAINE 2% (20 MG/ML) 5 ML SYRINGE
INTRAMUSCULAR | Status: DC | PRN
  Administered 2018-10-18: 100 mg via INTRAVENOUS

## 2018-10-18 MED ORDER — SUCCINYLCHOLINE CHLORIDE 200 MG/10ML IV SOSY
PREFILLED_SYRINGE | INTRAVENOUS | Status: AC
  Filled 2018-10-18: qty 10

## 2018-10-18 MED ORDER — OXYCODONE HCL 5 MG PO TABS
10.0000 mg | ORAL_TABLET | ORAL | Status: DC | PRN
  Administered 2018-10-18 – 2018-10-20 (×6): 10 mg via ORAL
  Filled 2018-10-18 (×6): qty 2

## 2018-10-18 MED ORDER — DIPHENHYDRAMINE HCL 50 MG/ML IJ SOLN
INTRAMUSCULAR | Status: AC
  Filled 2018-10-18: qty 1

## 2018-10-18 MED ORDER — POLYETHYLENE GLYCOL 3350 17 G PO PACK
17.0000 g | PACK | Freq: Every day | ORAL | Status: DC | PRN
  Administered 2018-10-19: 17 g via ORAL
  Filled 2018-10-18: qty 1

## 2018-10-18 MED ORDER — PHENYLEPHRINE HCL (PRESSORS) 10 MG/ML IV SOLN
INTRAVENOUS | Status: DC | PRN
  Administered 2018-10-18 (×2): 200 ug via INTRAVENOUS

## 2018-10-18 MED ORDER — PROPOFOL 10 MG/ML IV BOLUS
INTRAVENOUS | Status: DC | PRN
  Administered 2018-10-18: 130 mg via INTRAVENOUS
  Administered 2018-10-18: 70 mg via INTRAVENOUS

## 2018-10-18 MED ORDER — LORAZEPAM 2 MG/ML IJ SOLN
1.0000 mg | Freq: Once | INTRAMUSCULAR | Status: AC
  Administered 2018-10-18: 1 mg via INTRAVENOUS
  Filled 2018-10-18: qty 1

## 2018-10-18 MED ORDER — LACTATED RINGERS IV SOLN
INTRAVENOUS | Status: DC
  Administered 2018-10-18 (×2): via INTRAVENOUS

## 2018-10-18 MED ORDER — CEFAZOLIN SODIUM-DEXTROSE 2-4 GM/100ML-% IV SOLN
INTRAVENOUS | Status: AC
  Filled 2018-10-18: qty 100

## 2018-10-18 MED ORDER — HYDROMORPHONE HCL 1 MG/ML IJ SOLN
INTRAMUSCULAR | Status: AC
  Filled 2018-10-18: qty 1

## 2018-10-18 MED ORDER — EPHEDRINE 5 MG/ML INJ
INTRAVENOUS | Status: AC
  Filled 2018-10-18: qty 30

## 2018-10-18 MED ORDER — ONDANSETRON HCL 4 MG/2ML IJ SOLN
INTRAMUSCULAR | Status: DC | PRN
  Administered 2018-10-18: 4 mg via INTRAVENOUS

## 2018-10-18 MED ORDER — CEFAZOLIN SODIUM-DEXTROSE 2-4 GM/100ML-% IV SOLN
2.0000 g | Freq: Three times a day (TID) | INTRAVENOUS | Status: AC
  Administered 2018-10-19: 2 g via INTRAVENOUS
  Filled 2018-10-18 (×2): qty 100

## 2018-10-18 MED ORDER — DEXAMETHASONE SODIUM PHOSPHATE 10 MG/ML IJ SOLN
INTRAMUSCULAR | Status: AC
  Filled 2018-10-18: qty 2

## 2018-10-18 MED ORDER — ARTIFICIAL TEARS OPHTHALMIC OINT
TOPICAL_OINTMENT | OPHTHALMIC | Status: AC
  Filled 2018-10-18: qty 3.5

## 2018-10-18 MED ORDER — MIDAZOLAM HCL 5 MG/5ML IJ SOLN
INTRAMUSCULAR | Status: DC | PRN
  Administered 2018-10-18: 2 mg via INTRAVENOUS

## 2018-10-18 MED ORDER — SODIUM CHLORIDE 0.9 % IV SOLN
INTRAVENOUS | Status: DC | PRN
  Administered 2018-10-18: 35 ug/min via INTRAVENOUS

## 2018-10-18 MED ORDER — MEPERIDINE HCL 25 MG/ML IJ SOLN: 6.2500 mg | INTRAMUSCULAR | Status: DC | PRN

## 2018-10-18 MED ORDER — ONDANSETRON HCL 4 MG/2ML IJ SOLN
4.0000 mg | INTRAMUSCULAR | Status: DC | PRN
  Filled 2018-10-18: qty 2

## 2018-10-18 SURGICAL SUPPLY — 71 items
ADH SKN CLS APL DERMABOND .7 (GAUZE/BANDAGES/DRESSINGS) ×1
BAG DECANTER FOR FLEXI CONT (MISCELLANEOUS) ×3 IMPLANT
BIT DRILL 2.4 (BIT) ×1 IMPLANT
BLADE CLIPPER SURG (BLADE) ×2 IMPLANT
BLADE SURG 11 STRL SS (BLADE) ×2 IMPLANT
BONE CANC CHIPS 20CC PCAN1/4 (Bone Implant) ×2 IMPLANT
BUR MATCHSTICK NEURO 3.0 LAGG (BURR) ×2 IMPLANT
BUR PRECISION FLUTE 5.0 (BURR) ×2 IMPLANT
CAGE PEEK 10X14X11 (Cage) ×2 IMPLANT
CAGE PEEK 6X14X11 (Cage) IMPLANT
CAGE PEEK 8X14X11 (Cage) ×1 IMPLANT
CAGE SPNL 11X14X10XRADOPQ (Cage) IMPLANT
CANISTER SUCT 3000ML PPV (MISCELLANEOUS) ×3 IMPLANT
CHIPS CANC BONE 20CC PCAN1/4 (Bone Implant) ×1 IMPLANT
DERMABOND ADVANCED (GAUZE/BANDAGES/DRESSINGS) ×1
DERMABOND ADVANCED .7 DNX12 (GAUZE/BANDAGES/DRESSINGS) ×2 IMPLANT
DRAPE C-ARM 42X72 X-RAY (DRAPES) ×8 IMPLANT
DRAPE LAPAROTOMY 100X72 PEDS (DRAPES) ×4 IMPLANT
DRSG OPSITE POSTOP 4X6 (GAUZE/BANDAGES/DRESSINGS) ×1 IMPLANT
DURAPREP 6ML APPLICATOR 50/CS (WOUND CARE) ×4 IMPLANT
ELECT COATED BLADE 2.86 ST (ELECTRODE) ×2 IMPLANT
ELECT REM PT RETURN 9FT ADLT (ELECTROSURGICAL) ×4
ELECTRODE REM PT RTRN 9FT ADLT (ELECTROSURGICAL) ×2 IMPLANT
GLOVE BIO SURGEON STRL SZ7.5 (GLOVE) ×4 IMPLANT
GLOVE BIOGEL PI IND STRL 7.5 (GLOVE) ×3 IMPLANT
GLOVE BIOGEL PI INDICATOR 7.5 (GLOVE) ×3
GLOVE INDICATOR 7.0 STRL GRN (GLOVE) ×3 IMPLANT
GLOVE INDICATOR 7.5 STRL GRN (GLOVE) ×3 IMPLANT
GOWN STRL REUS W/ TWL LRG LVL3 (GOWN DISPOSABLE) ×2 IMPLANT
GOWN STRL REUS W/ TWL XL LVL3 (GOWN DISPOSABLE) ×1 IMPLANT
GOWN STRL REUS W/TWL 2XL LVL3 (GOWN DISPOSABLE) IMPLANT
GOWN STRL REUS W/TWL LRG LVL3 (GOWN DISPOSABLE) ×6
GOWN STRL REUS W/TWL XL LVL3 (GOWN DISPOSABLE) ×4
GRAFT BNE CANC CHIPS 1-8 20CC (Bone Implant) IMPLANT
HEMOSTAT POWDER KIT SURGIFOAM (HEMOSTASIS) ×4 IMPLANT
KIT BASIN OR (CUSTOM PROCEDURE TRAY) ×4 IMPLANT
KIT TURNOVER KIT B (KITS) ×3 IMPLANT
NDL SPNL 18GX3.5 QUINCKE PK (NEEDLE) ×1 IMPLANT
NEEDLE HYPO 22GX1.5 SAFETY (NEEDLE) ×4 IMPLANT
NEEDLE SPNL 18GX3.5 QUINCKE PK (NEEDLE) ×2 IMPLANT
NS IRRIG 1000ML POUR BTL (IV SOLUTION) ×3 IMPLANT
PACK LAMINECTOMY NEURO (CUSTOM PROCEDURE TRAY) ×4 IMPLANT
PAD ARMBOARD 7.5X6 YLW CONV (MISCELLANEOUS) ×11 IMPLANT
PASTE BONE GRAFTON 1CC (Bone Implant) ×1 IMPLANT
PEEK ANATOMIC STRUT 5X14X11MM (Peek) ×1 IMPLANT
PIN MAYFIELD SKULL DISP (PIN) ×2 IMPLANT
PLATE 37MM ×1 IMPLANT
ROD SPINAL PRE CUT 3.5X70 (Rod) ×2 IMPLANT
SCREW MA INFINITY 3.5X12 (Screw) ×6 IMPLANT
SCREW MULTI AX 25X5.5 TRNS (Screw) IMPLANT
SCREW MULTI AXIAL 5.5X25 (Screw) ×4 IMPLANT
SCREW SELF TAP VAR 4.0X13 (Screw) ×4 IMPLANT
SCREW SET 3600315 STANDARD (Screw) ×16 IMPLANT
SCREW SET 3600315 STD (Screw) IMPLANT
SPACER SPNL 11X14X6XPEEK CVD (Cage) IMPLANT
SPCR SPNL 11X14X6XPEEK CVD (Cage) IMPLANT
SPONGE INTESTINAL PEANUT (DISPOSABLE) ×2 IMPLANT
SPONGE LAP 4X18 RFD (DISPOSABLE) IMPLANT
SPONGE SURGIFOAM ABS GEL SZ50 (HEMOSTASIS) IMPLANT
STAPLER VISISTAT 35W (STAPLE) ×2 IMPLANT
SUT ETHILON 3 0 FSL (SUTURE) IMPLANT
SUT MNCRL AB 3-0 PS2 18 (SUTURE) ×4 IMPLANT
SUT VIC AB 0 CT1 18XCR BRD8 (SUTURE) ×1 IMPLANT
SUT VIC AB 0 CT1 8-18 (SUTURE) ×4
SUT VIC AB 2-0 CP2 18 (SUTURE) ×3 IMPLANT
SUT VIC AB 3-0 SH 8-18 (SUTURE) ×3 IMPLANT
TAPE CLOTH 3X10 TAN LF (GAUZE/BANDAGES/DRESSINGS) ×2 IMPLANT
TOWEL GREEN STERILE (TOWEL DISPOSABLE) ×4 IMPLANT
TOWEL GREEN STERILE FF (TOWEL DISPOSABLE) ×4 IMPLANT
UNDERPAD 30X30 (UNDERPADS AND DIAPERS) ×2 IMPLANT
WATER STERILE IRR 1000ML POUR (IV SOLUTION) ×4 IMPLANT

## 2018-10-18 NOTE — ED Provider Notes (Addendum)
Care assumed from [facility], PA-C  Please see her full H&P.  In short,  Travis Hart is a 68 y.o. male presents for continued pain after 10 foot fall on 10/13/2018.  Was evaluated at [facility] but left AMA prior to receiving an MRI of his neck and back.  He complains of paresthesias in the bilateral upper extremities, neck pain and right shoulder pain.  On initial provider exam he had decreased reflexes in the right upper extremity and increased reflexes in the left lower extremity, bilateral lower extremities.  Additionally, patient had clonus.   Lives in [place] therefore will need neurology/neurosurgery follow-up here in [place].  Physical Exam  BP 108/67   Pulse 64   Temp (!) 97.5 F (36.4 C) (Oral)   Resp 18   Ht 5' (1.524 m)   Wt 90.7 kg   SpO2 96%   BMI 39.06 kg/m     ED Course/Procedures   Clinical Course as of Oct 17 657  Sat Oct 17, 2018  2230 Plan: Patient pending MRI of the cervical and thoracic spine.  He will need Ativan for this.  Plan for consult with neurosurgery and/or neurology afterwards.   [HM]    Clinical Course User Index [HM] Anil Havard, Gwenlyn Perking     MDM   Patient with neck pain and neurologic findings after fall.  He is currently in a hard collar.  Awaiting MRI.  6:59 AM At shift change care was transferred to Travis Etienne, MD who will follow pending studies, re-evaulate and determine disposition.     1. Neck pain          Beatrice Sehgal, Gwenlyn Perking 10/18/18 4235    Veryl Speak, MD 10/24/18 1505

## 2018-10-18 NOTE — ED Notes (Signed)
Patient transported to MRI 

## 2018-10-18 NOTE — ED Notes (Signed)
Gave update to patient's wife. Informed her of pt's plan of care and that we were waiting for the MRI results.

## 2018-10-18 NOTE — Transfer of Care (Signed)
Immediate Anesthesia Transfer of Care Note  Patient: Travis Hart  Procedure(s) Performed: CERVICAL CORPECTOMY CERVICAL SIX (N/A Spine Cervical) POSTERIOR CERVICAL FUSION/FORAMINOTOMY  CERVICAL FOUR-THORACIC ONE (N/A Spine Cervical)  Patient Location: PACU  Anesthesia Type:General  Level of Consciousness: drowsy  Airway & Oxygen Therapy: Patient Spontanous Breathing and Patient connected to face mask oxygen  Post-op Assessment: Report given to RN and Post -op Vital signs reviewed and stable  Post vital signs: Reviewed and stable  Last Vitals:  Vitals Value Taken Time  BP 133/77 10/18/18 1915  Temp    Pulse 102 10/18/18 1916  Resp 23 10/18/18 1916  SpO2 98 % 10/18/18 1916  Vitals shown include unvalidated device data.  Last Pain:  Vitals:   10/18/18 0344  TempSrc:   PainSc: Asleep         Complications: No apparent anesthesia complications

## 2018-10-18 NOTE — ED Provider Notes (Signed)
68 yo M with a chief complaint of right-sided neck pain after fall about 6 days ago.  Was seen in a trauma center at that time and had CT imaging of the head and the neck that were read as negative.  I actually saw this patient in the ED yesterday and ordered an MRI.  Unfortunately it was only able to be obtained this morning.  Patient has a likely C5-C6 fracture.  My exam is concerning for spinal cord injury.  I discussed the case with Dr. Venetia Constable, neurosurgery he recommended obtaining a CT scan here.  I discussed the case with the radiologist concerning the patient's imaging.  They are concerned that he has unstable 3 column C-spine fracture.     Deno Etienne, DO 10/18/18 1539

## 2018-10-18 NOTE — ED Notes (Signed)
Pt resting a little quieter at this time.

## 2018-10-18 NOTE — ED Notes (Signed)
Patient transported to CT 

## 2018-10-18 NOTE — Consult Note (Addendum)
Neurosurgery Consultation  Reason for Consult: Cervical spine fracture Referring Physician: Tyrone Nine  CC: Neck pain  HPI: This is a 68 y.o. man that presents 1 week after a fall off a ladder. He has a history of some baseline hand numbness and was diagnosed with ankylosing spondylitis decades ago. But since his fall, he has had significantly worsened numbness and new hand weakness. No significant parasthesias, right arm is weaker than the left. He has not been ambulating well but at baseline he walks with a cane due to what he says are frequent hip dislocations. He also says that he feels like his head is a bobble head and has been holding his head up by putting his hand on his forehead.    ROS: A 14 point ROS was performed and is negative except as noted in the HPI.   PMHx: History reviewed. No pertinent past medical history. FamHx: History reviewed. No pertinent family history. SocHx:  has no history on file for tobacco, alcohol, and drug.  Exam: Vital signs in last 24 hours: Temp:  [97.5 F (36.4 C)] 97.5 F (36.4 C) (07/04 1500) Pulse Rate:  [51-75] 70 (07/05 1111) Resp:  [16-18] 16 (07/05 1111) BP: (108-171)/(65-99) 145/79 (07/05 1111) SpO2:  [90 %-98 %] 96 % (07/05 1111) Weight:  [90.7 kg] 90.7 kg (07/04 1502) General: Awake, alert, cooperative, lying in bed in NAD Head: normocephalic and atruamatic HEENT: in rigid cervical collar Pulmonary: breathing room air comfortably, no evidence of increased work of breathing Cardiac: RRR Abdomen: S NT ND Extremities: warm and well perfused x4 Neuro: AOx3, PERRL, EOMI, FS Strength 4/5 in RUE, 4+/5 in LUE, 4+/5 in BLE, SILT except for bilateral dense hand numbness, no hoffman's   Assessment and Plan: 68 y.o. man s/p fall with neck pain and symptoms of instability with hand and numbness. MRI and CT C-spine personally reviewed. MRI has severe motion artifact but was suggestive of a fracture, CT C-spine confirmed fracture line through C6  vertebral body into the C5-6 disc space with fracture of the C6-7 posterior elements.  -3 column injury with clinical sx of instability, OR for C5-6 ACDF followed by C4-T1 posterior instrumented fusion  Judith Part, MD 10/18/18 12:09 PM [facility]

## 2018-10-18 NOTE — ED Notes (Signed)
Pt still awaiting MRI 

## 2018-10-18 NOTE — Anesthesia Postprocedure Evaluation (Signed)
Anesthesia Post Note  Patient: BASEL DEFALCO  Procedure(s) Performed: CERVICAL CORPECTOMY CERVICAL SIX (N/A Spine Cervical) POSTERIOR CERVICAL FUSION/FORAMINOTOMY  CERVICAL FOUR-THORACIC ONE (N/A Spine Cervical)     Patient location during evaluation: PACU Anesthesia Type: General Level of consciousness: awake and alert Pain management: pain level controlled Vital Signs Assessment: post-procedure vital signs reviewed and stable Respiratory status: spontaneous breathing, nonlabored ventilation, respiratory function stable and patient connected to nasal cannula oxygen Cardiovascular status: blood pressure returned to baseline and stable Postop Assessment: no apparent nausea or vomiting Anesthetic complications: no    Last Vitals:  Vitals:   10/18/18 1945 10/18/18 2000  BP: 118/65 137/68  Pulse: 85 84  Resp: 15 17  Temp:  36.6 C  SpO2: 98% 97%    Last Pain:  Vitals:   10/18/18 1913  TempSrc:   PainSc: [place] DAVID

## 2018-10-18 NOTE — Progress Notes (Signed)
1 bag and cane taken to PACU.

## 2018-10-18 NOTE — Anesthesia Procedure Notes (Signed)
Procedure Name: Intubation Date/Time: 10/18/2018 12:54 PM Performed by: Inda Coke, CRNA Pre-anesthesia Checklist: Patient identified, Emergency Drugs available, Suction available and Patient being monitored Patient Re-evaluated:Patient Re-evaluated prior to induction Oxygen Delivery Method: Circle System Utilized Preoxygenation: Pre-oxygenation with 100% oxygen Induction Type: IV induction Ventilation: Mask ventilation without difficulty and Oral airway inserted - appropriate to patient size Laryngoscope Size: Glidescope and 4 Grade View: Grade I Tube type: Oral Tube size: 7.5 mm Number of attempts: 1 Airway Equipment and Method: Oral airway,  Video-laryngoscopy and Rigid stylet Placement Confirmation: ETT inserted through vocal cords under direct vision,  positive ETCO2 and breath sounds checked- equal and bilateral Secured at: 22 cm Tube secured with: Tape Dental Injury: Teeth and Oropharynx as per pre-operative assessment  Comments: Cervical collar remains in place and head and neck remain midline and neutral throughout glidescope intubation.

## 2018-10-18 NOTE — Progress Notes (Signed)
Attempted MRI again, was able to obtain some images of Cervical spine but again was unable to lay back onto coil and was in a lot of pain.  He was moving throughout exam excessively and I checked on him repeatedly to inform him of motion and ask if I could do anything. After obtaining motion degraded images of C Spine, Patient was moving arms.  When I checked on him and asked what I could do to make him more comfortable, he asked if we were done. I informed him due to the motion we were not and I wasn't sure if some of the images we had obtained were diagnostic, he then said to get him out, he was in too much pain to do this.

## 2018-10-18 NOTE — Anesthesia Preprocedure Evaluation (Signed)
Anesthesia Evaluation  Patient identified by MRN, date of birth, ID band Patient awake    Reviewed: Allergy & Precautions, NPO status , Patient's Chart, lab work & pertinent test results  Airway Mallampati: III  TM Distance: >3 FB Neck ROM: Limited    Dental   Pulmonary    Pulmonary exam normal        Cardiovascular Normal cardiovascular exam     Neuro/Psych    GI/Hepatic   Endo/Other    Renal/GU      Musculoskeletal  (+) Arthritis ,   Abdominal   Peds  Hematology   Anesthesia Other Findings   Reproductive/Obstetrics                             Anesthesia Physical Anesthesia Plan  ASA: III and emergent  Anesthesia Plan: General   Post-op Pain Management:    Induction: Intravenous  PONV Risk Score and Plan: 2 and Ondansetron, Dexamethasone and Midazolam  Airway Management Planned: Video Laryngoscope Planned and Oral ETT  Additional Equipment:   Intra-op Plan:   Post-operative Plan: Extubation in OR  Informed Consent: I have reviewed the patients History and Physical, chart, labs and discussed the procedure including the risks, benefits and alternatives for the proposed anesthesia with the patient or authorized representative who has indicated his/her understanding and acceptance.       Plan Discussed with: CRNA and Surgeon  Anesthesia Plan Comments:         Anesthesia Quick Evaluation

## 2018-10-18 NOTE — Op Note (Signed)
PATIENT: Travis Hart  PROCEDURE DATE: 10/18/18  PRE-OPERATIVE DIAGNOSIS:  Unstable cervical spine fracture   POST-OPERATIVE DIAGNOSIS:  Unstable cervical spine fracture   PROCEDURE:  C6 anterior cervical corpectomy C5-C7 anterior instrumented fusion, Posterior C4-T1 cervicothroacic instrumented fusion   SURGEON:  Surgeon(s) and Role:    Judith Part, MD - Primary   ANESTHESIA: ETGA   BRIEF HISTORY: This is a 68 year old man who presented 1 week after a fall off a ladder with upper extremity numbness, weakness, neck pain, and symptoms of cervical instability. Of note, he has known ankylosing spondylitis. The patient was found to have a 3 column severe cervical spine fracture. This was discussed with the patient as well as risks, benefits, and alternatives and the patient wished to proceed with surgical treatment. I discussed that this is an unstable fracture and that he is high risk of neurologic deterioration with or without surgery, but it is clearly unstable and needs fixation. 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 I recommended an ACDF at C5-6, as his anterior fracture goes into this disc space and is likely the mobile segment anteriorly. Posteriorly, he will require a large construct given the two very large lever arms as well as fracture of his posterior elements at C6-7. Given his severe instability, I opted to leave him supine and do the ACDF first without flipping him in pins and risking movement of his unstable fracture.    OPERATIVE DETAIL:  The first stage of the surgery was the planned ACDF. The patient was taken to the operating room and placed on the OR table in the supine position. A formal time out was performed with two patient identifiers and confirmed the operative site. Anesthesia was induced by the anesthesia team.  Fluoroscopy was used to localize the surgical level and an  incision was marked in a skin crease. The area was then prepped and draped in a sterile fashion. A transverse linear incision was made on the right side of the neck. The platysma was divided and the sternocleidomastoid muscle was identified. The carotid sheath was palpated, identified, and retracted laterally with the sternocleidomastoid muscle. The strap muscles were identified and retracted medially and the pretracheal fascia was entered. A bent spinal needle was used with fluoroscopy to localize the surgical level after dissection. The longus colli were elevated bilaterally and a self-retaining retractor was placed. The endotracheal tube cuff balloon was deflated and reinflated after retractor placement.   After identifying the appropriate level and starting the discectomy, I performed a discectomy at C5-6. During the discectomy, it was clear that the C6 vertebral body was completely fractured and structurally unsound. It was removed with curettes with minimal use of the drill due to the comminuted fracture through the bone. Not surprisingly, there was adherent calcified ligament that had to be carefully dissected off of the dura. This was thankfully accomplished without a dural defect. A PEEK corpectomy spacer was then sized and packed with autograft and allograft ([employer]tr[employer]) and placed with good purchase. An anterior plate was then sized, confirmed with xray, then secured with four, 72mm screws. This was again checked on xray, screws were final tightened, hemostasis was obtained, and the incision was closed in layers.  The second stage of the surgery was the posterior instrumented fusion. The patient was placed in a Mayfield head holder and placed prone on the OR table. Fluoro was used to confirm  position and mark the incision. After prepping and draping, a midline incision was created and subperiosteal dissection was performed bilaterally to expose the lateral masses of C4-C7 and the pedicles of T1. The  surgical level was again confirmed using fluoroscopy. Lateral mass screws were placed bilaterally at C4-C6 ([employer]tronic). He has fully formed transverse foramina at C7 and small lateral masses at that level, so I decided not to place instrumentation at C7. A small laminotomy was made bilaterally at T1 to palpate the pedicles. It was difficult to get good enough views to be completely confident in fluoroscopic guidance, so I placed the T1 pedicle screws using landmarks and palpating the medial pedicle wall to feel for breach. The bony surfaces were decorticated bilaterally over the fusion surface from C4-T1. Allograft ([employer]tronic) was used as the patient was fused everywhere else posteriorly and I did not want to disrupt any of this. A rod was measured, cut, and bent then connected into the bilateral screws from C4-T1. These were checked on fluoroscopy and then final tightened.  The posterior incision was then copiously irrigated, hemostasis was confirmed, counts were correct, a drain was inserted subfascially, tunneled, and secured. The incision was closed in layers in the usual fashion. The patient was then turned supine, the [place]field was removed, and the patient was returned to anesthesia for emergence.    EBL:  750mL   DRAINS: Posterior cervical subfascial drain   SPECIMENS: none   Jadene Pierinihomas A Ostergard, MD 10/18/18 12:38 PM

## 2018-10-19 ENCOUNTER — Encounter (HOSPITAL_COMMUNITY): Payer: Self-pay | Admitting: Neurological Surgery

## 2018-10-19 ENCOUNTER — Inpatient Hospital Stay (HOSPITAL_COMMUNITY): Payer: Medicare Other

## 2018-10-19 MED ORDER — FAMOTIDINE 20 MG PO TABS
20.0000 mg | ORAL_TABLET | Freq: Two times a day (BID) | ORAL | Status: DC
  Administered 2018-10-19 – 2018-10-20 (×3): 20 mg via ORAL
  Filled 2018-10-19 (×3): qty 1

## 2018-10-19 MED FILL — Thrombin For Soln 5000 Unit: CUTANEOUS | Qty: 5000 | Status: AC

## 2018-10-19 MED FILL — Gelatin Absorbable MT Powder: OROMUCOSAL | Qty: 1 | Status: AC

## 2018-10-19 NOTE — Evaluation (Signed)
Physical Therapy Evaluation Patient Details Name: Travis SlickerKenneth R Hart MRN: [id number] DOB: February 21, 1951 Today's Date: 10/19/2018   History of Present Illness   68 y.o. man s/p fall with neck pain and symptoms of instability with hand and numbness. CT C-spine confirmed fracture line through C6 vertebral body into the C5-6 disc space with fracture of the C6-7 posterior elements. Pt s/p C5-6 ACDF followed by C4-T1 posterior instrumented fusion on 7/5. PMH: arthritis, ankylosing spondylitis, freq hip dislocations.  Clinical Impression  Pt admitted with above. Pt with weakness x 4 extremities, R UE worse than L and L LE worse than R.Pt with noted anxiety, impaired sequencing, impaired co-ordination, and requires assistx2 for transfers and ambulation. Pt was mod I with quad cane PTA. Recommend CIR upon d/c for maximal functional recovery. Acute PT to cont to follow.    Follow Up Recommendations CIR    Equipment Recommendations  (TBD)    Recommendations for Other Services Rehab consult     Precautions / Restrictions Precautions Precautions: Cervical Precaution Booklet Issued: No Precaution Comments: pt with very stiff L leg, educated on logrolling and not raising arms above head Restrictions Weight Bearing Restrictions: No      Mobility  Bed Mobility Overal bed mobility: Needs Assistance Bed Mobility: Rolling;Sidelying to Sit Rolling: Mod assist;Max assist;+2 for physical assistance Sidelying to sit: Mod assist;Max assist;+2 for physical assistance       General bed mobility comments: pt with increased anxiety due to pain, required assist for LE management due to"My L leg is so stiff", maxAx2 with trunk elevation due to limited functional push from UEs  Transfers Overall transfer level: Needs assistance Equipment used: Rolling walker (2 wheeled) Transfers: Sit to/from Stand Sit to Stand: Mod assist;+2 physical assistance         General transfer comment: modA to power up, pt with  tendency to posterior lean and slide bottom forward to edge, unsafely, requiring PT/OT to prevent pt from falling to floor  Ambulation/Gait Ambulation/Gait assistance: Min assist;Mod assist;+2 physical assistance;+2 safety/equipment Gait Distance (Feet): 12 Feet Assistive device: Rolling walker (2 wheeled) Gait Pattern/deviations: Step-through pattern;Decreased stride length;Ataxic;Narrow base of support Gait velocity: slow Gait velocity interpretation: <1.8 ft/sec, indicate of risk for recurrent falls General Gait Details: amb from one side of the bed around to the other to sit in chair, pt with minimal foot clearance , R worse than L, pt with decrased L LE WBing, improved some with amb progression, minA for walker mangement, pt c/o increased neck pain with amb  Stairs            Wheelchair Mobility    Modified Rankin (Stroke Patients Only)       Balance Overall balance assessment: Needs assistance Sitting-balance support: Bilateral upper extremity supported;Feet supported Sitting balance-Leahy Scale: Poor Sitting balance - Comments: pt supporting self with hands behind himselt into strong posterior lean Postural control: Posterior lean Standing balance support: Bilateral upper extremity supported Standing balance-Leahy Scale: Poor Standing balance comment: dependent on RW                             Pertinent Vitals/Pain Pain Assessment: 0-10 Pain Score: 5 (worse wit mobility) Pain Location: neck Pain Descriptors / Indicators: Sharp;Sore Pain Intervention(s): Monitored during session    Home Living Family/patient expects to be discharged to:: Inpatient rehab                 Additional Comments: pt lives in 1  story home with spouse, 3STE with no hand rail, walk in shower with handicapped height toliet    Prior Function Level of Independence: Needs assistance   Gait / Transfers Assistance Needed: used quad cane  ADL's / Homemaking Assistance  Needed: reports unable to don socks or clip toe nails  Comments: pt was driving and up on ladders     Hand Dominance   Dominant Hand: Right    Extremity/Trunk Assessment   Upper Extremity Assessment Upper Extremity Assessment: Defer to OT evaluation    Lower Extremity Assessment Lower Extremity Assessment: RLE deficits/detail;LLE deficits/detail RLE Deficits / Details: grossly 3-/5, impaired co-ordination RLE Sensation: decreased light touch LLE Deficits / Details: pt preferes L LE in extension due to "it gets so stiff", grossly 3-/5  LLE Sensation: decreased light touch LLE Coordination: decreased gross motor    Cervical / Trunk Assessment Cervical / Trunk Assessment: Other exceptions Cervical / Trunk Exceptions: neck surgery  Communication   Communication: No difficulties  Cognition Arousal/Alertness: Awake/alert Behavior During Therapy: Anxious Overall Cognitive Status: Within Functional Limits for tasks assessed                                 General Comments: pt with decreased insight to safety however suspect this is baseline as he climbed a ladder despite having to use a cane for amb      General Comments General comments (skin integrity, edema, etc.): pt with noted bilat UE co-ordination deficits, spilled sode trying to pour into cup    Exercises     Assessment/Plan    PT Assessment Patient needs continued PT services  PT Problem List Decreased strength;Decreased range of motion;Decreased activity tolerance;Decreased balance;Decreased mobility;Decreased coordination;Decreased cognition;Decreased knowledge of use of DME       PT Treatment Interventions DME instruction;Gait training;Stair training;Functional mobility training;Therapeutic activities;Therapeutic exercise;Balance training;Neuromuscular re-education    PT Goals (Current goals can be found in the Care Plan section)  Acute Rehab PT Goals Patient Stated Goal: get stronger PT Goal  Formulation: With patient Time For Goal Achievement: 11/02/18 Potential to Achieve Goals: Good    Frequency Min 5X/week   Barriers to discharge        Co-evaluation PT/OT/SLP Co-Evaluation/Treatment: Yes Reason for Co-Treatment: Complexity of the patient's impairments (multi-system involvement);For patient/therapist safety PT goals addressed during session: Mobility/safety with mobility         AM-PAC PT "6 Clicks" Mobility  Outcome Measure Help needed turning from your back to your side while in a flat bed without using bedrails?: A Lot Help needed moving from lying on your back to sitting on the side of a flat bed without using bedrails?: A Lot Help needed moving to and from a bed to a chair (including a wheelchair)?: A Lot Help needed standing up from a chair using your arms (e.g., wheelchair or bedside chair)?: A Lot Help needed to walk in hospital room?: A Lot Help needed climbing 3-5 steps with a railing? : A Lot 6 Click Score: 12    End of Session Equipment Utilized During Treatment: Gait belt Activity Tolerance: Patient tolerated treatment well Patient left: in chair;with call bell/phone within reach;with chair alarm set Nurse Communication: Mobility status(2 people to assist back to bed) PT Visit Diagnosis: Unsteadiness on feet (R26.81);Muscle weakness (generalized) (M62.81);Difficulty in walking, not elsewhere classified (R26.2)    Time: 3016-0109 PT Time Calculation (min) (ACUTE ONLY): 35 min   Charges:   PT  Evaluation $PT Eval Moderate Complexity: 1 Mod          Lewis ShockAshly Leyani Gargus, PT, DPT Acute Rehabilitation Services Pager #: [phone] Office #: [phone]   Iona Hansenshly M Delainey Winstanley 10/19/2018, 2:38 PM

## 2018-10-19 NOTE — Progress Notes (Signed)
Rehab Admissions Coordinator Note:  Patient was screened by Cleatrice Burke for appropriateness for an Inpatient Acute Rehab Consult per PT recs. .  At this time, we are recommending Inpatient Rehab consult.  Cleatrice Burke RN MSN 10/19/2018, 2:52 PM  I can be reached at [phone].

## 2018-10-19 NOTE — Progress Notes (Addendum)
Neurosurgery Service Progress Note  Subjective: No acute events overnight, hand numbness and weakness improving  Objective: Vitals:   10/18/18 2000 10/18/18 2031 10/18/18 2330 10/19/18 0309  BP: 137/68 (!) 156/75 132/70 (!) 147/70  Pulse: 84 87 76 79  Resp: 17 18 18    Temp: 97.9 F (36.6 C) 97.8 F (36.6 C) 97.8 F (36.6 C)   TempSrc:  Oral Oral   SpO2: 97% 97% 96% 97%  Weight:      Height:       Temp (24hrs), Avg:97.7 F (36.5 C), Min:97.2 F (36.2 C), Max:97.9 F (36.6 C)  CBC Latest Ref Rng & Units 10/18/2018 10/18/2018 10/17/2018  WBC 4.0 - 10.5 K/uL 12.3(H) - 11.1(H)  Hemoglobin 13.0 - 17.0 g/dL 12.1(L) 12.6(L) 14.7  Hematocrit 39.0 - 52.0 % 36.2(L) 37.0(L) 43.5  Platelets 150 - 400 K/uL 198 - 198   BMP Latest Ref Rng & Units 10/18/2018 10/17/2018  Glucose 70 - 99 mg/dL 098(J130(H) 191(Y107(H)  BUN 8 - 23 mg/dL - 15  Creatinine 7.820.61 - 1.24 mg/dL 9.561.08 2.130.81  Sodium 086135 - 145 mmol/L 137 136  Potassium 3.5 - 5.1 mmol/L 4.0 4.0  Chloride 98 - 111 mmol/L - 101  CO2 22 - 32 mmol/L - 26  Calcium 8.9 - 10.3 mg/dL - 9.3    Intake/Output Summary (Last 24 hours) at 10/19/2018 0815 Last data filed at 10/19/2018 [id number] Gross per 24 hour  Intake 3800 ml  Output 3640 ml  Net 160 ml    Current Facility-Administered Medications:  .  acetaminophen (TYLENOL) tablet 650 mg, 650 mg, Oral, Q4H PRN **OR** acetaminophen (TYLENOL) suppository 650 mg, 650 mg, Rectal, Q4H PRN, Luellen Howson A, MD .  ceFAZolin (ANCEF) IVPB 2g/100 mL premix, 2 g, Intravenous, Q8H, Anastashia Westerfeld A, MD, Last Rate: 200 mL/hr at 10/19/18 0111, 2 g at 10/19/18 0111 .  docusate sodium (COLACE) capsule 100 mg, 100 mg, Oral, BID, Jadene Pierinistergard, Macen Joslin A, MD, 100 mg at 10/18/18 2109 .  famotidine (PEPCID) IVPB 20 mg premix, 20 mg, Intravenous, Q12H, Clarnce Homan, Clovis Puhomas A, MD, Last Rate: 100 mL/hr at 10/18/18 2109, 20 mg at 10/18/18 2109 .  [START ON 10/20/2018] heparin injection 5,000 Units, 5,000 Units, Subcutaneous, Q8H, Kooper Godshall,  Sieara Bremer A, MD .  HYDROmorphone (DILAUDID) injection 0.5 mg, 0.5 mg, Intravenous, Q3H PRN, Jadene Pierinistergard, Brealyn Baril A, MD, 0.5 mg at 10/19/18 0102 .  labetalol (NORMODYNE) injection 10-40 mg, 10-40 mg, Intravenous, Q10 min PRN, Jadene Pierinistergard, Charletta Voight A, MD .  lactated ringers infusion, , Intravenous, Continuous, Arta Brucessey, Kevin, MD, Last Rate: 10 mL/hr at 10/18/18 1201 .  ondansetron (ZOFRAN) tablet 4 mg, 4 mg, Oral, Q4H PRN **OR** ondansetron (ZOFRAN) injection 4 mg, 4 mg, Intravenous, Q4H PRN, Danna Sewell A, MD .  oxyCODONE (Oxy IR/ROXICODONE) immediate release tablet 10 mg, 10 mg, Oral, Q4H PRN, Jadene Pierinistergard, Micah Galeno A, MD, 10 mg at 10/19/18 0721 .  oxyCODONE (Oxy IR/ROXICODONE) immediate release tablet 5 mg, 5 mg, Oral, Q4H PRN, Sammye Staff A, MD .  polyethylene glycol (MIRALAX / GLYCOLAX) packet 17 g, 17 g, Oral, Daily PRN, Jadene Pierinistergard, Joette Schmoker A, MD .  promethazine (PHENERGAN) tablet 12.5-25 mg, 12.5-25 mg, Oral, Q4H PRN, Jadene Pierinistergard, Shyanne Mcclary A, MD   Physical Exam: AOx3, PERRL, EOMI, FS, Strength 4+/5 in BUE and BLE, SILTx4 except for mild hand numbness, no drift Incisions c/d/i Drain w/ serosang output  Assessment & Plan: 68 y.o. man with AS s/p 3 column unstable cervical spine fracture s/p anterior cervical corpectomy and posterior instrumented fusion, recovering well.  -  AP/lateral/swimmer's view show hardware intact without complicating feature -exam improving -activity as tolerated, no collar needed -PT/OT eval -SCDs/TEDs, hold SQH until POD2 -continue drain, output still significant -d/c foley  Judith Part  10/19/18 8:15 AM

## 2018-10-19 NOTE — Evaluation (Signed)
Occupational Therapy Evaluation Patient Details Name: Travis SlickerKenneth R Hart MRN: [id number] DOB: [birth year] Today's Date: 10/19/2018    History of Present Illness  68 y.o. man s/p fall with neck pain and symptoms of instability with hand and numbness. CT C-spine confirmed fracture line through C6 vertebral body into the C5-6 disc space with fracture of the C6-7 posterior elements. Pt s/p C5-6 ACDF followed by C4-T1 posterior instrumented fusion on 7/5. PMH: arthritis, ankylosing spondylitis, freq hip dislocations.   Clinical Impression   Pt admitted with above. He demonstrates the below listed deficits and will benefit from continued OT to maximize safety and independence with BADLs.  Pt presents to OT with generalized weakness, impaired sensation and FMC bil. Hands, impaired balance, decreased safety awareness, and decreased activity tolerance.  He currently requires min - mod A for UB ADLs and max - total A for LB ADLs.  He requires mod A +2 for functional mobility.  He lives with his wife, is disabled, but was mod I for ADLs.  Anticipate he will       Follow Up Recommendations  CIR;Supervision/Assistance - 24 hour    Equipment Recommendations  None recommended by OT    Recommendations for Other Services Rehab consult     Precautions / Restrictions Precautions Precautions: Cervical Precaution Booklet Issued: No Precaution Comments: pt with very stiff L leg, educated on logrolling and not raising arms above head Restrictions Weight Bearing Restrictions: No      Mobility Bed Mobility Overal bed mobility: Needs Assistance Bed Mobility: Rolling;Sidelying to Sit Rolling: Mod assist;Max assist;+2 for physical assistance Sidelying to sit: Mod assist;Max assist;+2 for physical assistance       General bed mobility comments: pt with increased anxiety due to pain, required assist for LE management due to"My L leg is so stiff", maxAx2 with trunk elevation due to limited functional push from  UEs  Transfers Overall transfer level: Needs assistance Equipment used: Rolling walker (2 wheeled) Transfers: Sit to/from Stand Sit to Stand: Mod assist;+2 physical assistance         General transfer comment: modA to power up, pt with tendency to posterior lean and slide bottom forward to edge, unsafely, requiring PT/OT to prevent pt from falling to floor    Balance Overall balance assessment: Needs assistance Sitting-balance support: Bilateral upper extremity supported;Feet supported Sitting balance-Leahy Scale: Poor Sitting balance - Comments: pt supporting self with hands behind himselt into strong posterior lean Postural control: Posterior lean Standing balance support: Bilateral upper extremity supported Standing balance-Leahy Scale: Poor Standing balance comment: dependent on RW                           ADL either performed or assessed with clinical judgement   ADL Overall ADL's : Needs assistance/impaired Eating/Feeding: Set up;Sitting Eating/Feeding Details (indicate cue type and reason): frequently spills items  Grooming: Oral care;Wash/dry hands;Wash/dry face;Set up;Sitting Grooming Details (indicate cue type and reason): increased time and effort  Upper Body Bathing: Minimal assistance;Sitting   Lower Body Bathing: Maximal assistance;Sit to/from stand Lower Body Bathing Details (indicate cue type and reason): unable to access kees distally  Upper Body Dressing : Moderate assistance;Sitting   Lower Body Dressing: Total assistance Lower Body Dressing Details (indicate cue type and reason): unable to access feet  Toilet Transfer: Moderate assistance;+2 for physical assistance;+2 for safety/equipment;Ambulation;Comfort height toilet;Grab bars;RW   Toileting- Clothing Manipulation and Hygiene: Maximal assistance;Sit to/from stand       Functional mobility  during ADLs: Moderate assistance;+2 for physical assistance;+2 for safety/equipment;Rolling  walker       Vision Baseline Vision/History: No visual deficits Patient Visual Report: No change from baseline       Perception     Praxis      Pertinent Vitals/Pain Pain Assessment: 0-10 Pain Score: 5  Pain Location: neck Pain Descriptors / Indicators: Sharp;Sore Pain Intervention(s): Monitored during session     Hand Dominance Right   Extremity/Trunk Assessment Upper Extremity Assessment Upper Extremity Assessment: RUE deficits/detail;LUE deficits/detail RUE Deficits / Details: grossly 4/5 - 4+/5. he demonstrates difficulty manipulating objects and spills items when self feeding  RUE Sensation: decreased light touch RUE Coordination: decreased fine motor LUE Deficits / Details: strength grossly 4/5-4+/5, mildly impaired FMC  LUE Coordination: decreased fine motor   Lower Extremity Assessment Lower Extremity Assessment: Defer to PT evaluation RLE Deficits / Details: grossly 3-/5, impaired co-ordination RLE Sensation: decreased light touch LLE Deficits / Details: pt preferes L LE in extension due to "it gets so stiff", grossly 3-/5  LLE Sensation: decreased light touch LLE Coordination: decreased gross motor   Cervical / Trunk Assessment Cervical / Trunk Assessment: Other exceptions Cervical / Trunk Exceptions: neck surgery   Communication Communication Communication: No difficulties   Cognition Arousal/Alertness: Awake/alert Behavior During Therapy: Anxious Overall Cognitive Status: Within Functional Limits for tasks assessed                                 General Comments: pt with decreased insight to safety however suspect this is baseline as he climbed a ladder despite having to use a cane for amb   General Comments  pt with noted bilat UE co-ordination deficits, spilled sode trying to pour into cup    Exercises     Shoulder Instructions      Home Living Family/patient expects to be discharged to:: Inpatient rehab                                  Additional Comments: pt lives in 1 story home with spouse, 3STE with no hand rail, walk in shower with handicapped height toliet      Prior Functioning/Environment Level of Independence: Needs assistance  Gait / Transfers Assistance Needed: used quad cane ADL's / Homemaking Assistance Needed: reports unable to don socks or clip toe nails   Comments: pt was driving and up on ladders        OT Problem List: Decreased strength;Decreased activity tolerance;Impaired balance (sitting and/or standing);Decreased coordination;Decreased safety awareness;Decreased knowledge of use of DME or AE;Decreased knowledge of precautions;Pain;Impaired UE functional use      OT Treatment/Interventions: Self-care/ADL training;Neuromuscular education;DME and/or AE instruction;Therapeutic activities;Patient/family education;Balance training    OT Goals(Current goals can be found in the care plan section) Acute Rehab OT Goals Patient Stated Goal: to get back to normal  OT Goal Formulation: With patient Time For Goal Achievement: 11/02/18 Potential to Achieve Goals: Good ADL Goals Pt Will Perform Grooming: with min guard assist;sitting Pt Will Perform Upper Body Bathing: with set-up;with supervision;sitting Pt Will Perform Lower Body Bathing: with min guard assist;with adaptive equipment;sit to/from stand Pt Will Perform Upper Body Dressing: with supervision;with set-up;sitting Pt Will Perform Lower Body Dressing: with min guard assist;sit to/from stand;with adaptive equipment Pt Will Transfer to Toilet: with min guard assist;ambulating;regular height toilet;bedside commode;grab bars Pt Will Perform Toileting -  Clothing Manipulation and hygiene: with min guard assist;with adaptive equipment;sit to/from stand Pt/caregiver will Perform Home Exercise Program: Right Upper extremity;Left upper extremity;With Supervision;With written HEP provided  OT Frequency: Min 2X/week   Barriers to  D/C: Decreased caregiver support  wife works during the day so pt needs to be mod I for return home        Co-evaluation PT/OT/SLP Co-Evaluation/Treatment: Yes Reason for Co-Treatment: Complexity of the patient's impairments (multi-system involvement);To address functional/ADL transfers;For patient/therapist safety PT goals addressed during session: Mobility/safety with mobility OT goals addressed during session: ADL's and self-care      AM-PAC OT "6 Clicks" Daily Activity     Outcome Measure Help from another person eating meals?: A Little Help from another person taking care of personal grooming?: A Little Help from another person toileting, which includes using toliet, bedpan, or urinal?: A Lot Help from another person bathing (including washing, rinsing, drying)?: A Lot Help from another person to put on and taking off regular upper body clothing?: A Lot Help from another person to put on and taking off regular lower body clothing?: Total 6 Click Score: 13   End of Session Equipment Utilized During Treatment: Rolling walker;Gait belt Nurse Communication: Mobility status  Activity Tolerance: Patient tolerated treatment well Patient left: in chair;with call bell/phone within reach;with chair alarm set  OT Visit Diagnosis: Unsteadiness on feet (R26.81);Muscle weakness (generalized) (M62.81);Pain Pain - part of body: Shoulder(neck and shoulders )                Time: [id number] OT Time Calculation (min): 46 min Charges:  OT General Charges $OT Visit: 1 Visit OT Evaluation $OT Eval Moderate Complexity: 1 Mod  Travis HawkingWendi Nelta Hart, OTR/L Acute Rehabilitation Services Pager [phone] Office [phone]   Travis HawkingConarpe, Travis Hart 10/19/2018, 3:54 PM

## 2018-10-19 NOTE — Plan of Care (Signed)
  Problem: Education: Goal: Ability to verbalize activity precautions or restrictions will improve Outcome: Progressing Goal: Knowledge of the prescribed therapeutic regimen will improve Outcome: Progressing   Problem: Activity: Goal: Ability to avoid complications of mobility impairment will improve Outcome: Progressing Goal: Will remain free from falls Outcome: Progressing   Problem: Clinical Measurements: Goal: Ability to maintain clinical measurements within normal limits will improve Outcome: Progressing   Problem: Pain Management: Goal: Pain level will decrease Outcome: Progressing   Problem: Skin Integrity: Goal: Will show signs of wound healing Outcome: Progressing

## 2018-10-20 ENCOUNTER — Encounter (HOSPITAL_COMMUNITY): Payer: Self-pay

## 2018-10-20 MED ORDER — SODIUM CHLORIDE 0.9 % IV SOLN
INTRAVENOUS | Status: DC
  Administered 2018-10-20 – 2018-10-23 (×5): via INTRAVENOUS

## 2018-10-20 MED ORDER — ORAL CARE MOUTH RINSE
15.0000 mL | Freq: Two times a day (BID) | OROMUCOSAL | Status: DC
  Administered 2018-10-21 – 2018-10-23 (×3): 15 mL via OROMUCOSAL

## 2018-10-20 NOTE — Progress Notes (Signed)
Physical Therapy Treatment Patient Details Name: Travis Hart MRN: [id number] DOB: 1950/09/03 Today's Date: 10/20/2018    History of Present Illness  68 y.o. man s/p fall with neck pain and symptoms of instability with hand and numbness. CT C-spine confirmed fracture line through C6 vertebral body into the C5-6 disc space with fracture of the C6-7 posterior elements. Pt s/p C5-6 ACDF followed by C4-T1 posterior instrumented fusion on 7/5. PMH: arthritis, ankylosing spondylitis, freq hip dislocations.    PT Comments    Pt progressing towards physical therapy goals. Was able to progress ambulation distance this session with chair follow 2 decreased tolerance for functional activity, however overall pt demonstrated an excellent rehab effort. Pt complaining of bouts of dizziness with standing activity, requiring seated rests as well. Continue to feel this pt is appropriate for CIR level follow up at d/c to maximize functional independence and decrease risk for falls at home.     Follow Up Recommendations  CIR     Equipment Recommendations  (TBD)    Recommendations for Other Services Rehab consult     Precautions / Restrictions Precautions Precautions: Cervical Precaution Booklet Issued: No Precaution Comments: Verbally reviewed precautions during functional mobility.  Restrictions Weight Bearing Restrictions: No    Mobility  Bed Mobility Overal bed mobility: Needs Assistance Bed Mobility: Rolling;Sidelying to Sit Rolling: Min assist Sidelying to sit: Mod assist;+2 for physical assistance       General bed mobility comments: Step-by-step cues for proper log roll technique. HOB elevated and use of rails required.   Transfers Overall transfer level: Needs assistance Equipment used: Rolling walker (2 wheeled) Transfers: Sit to/from Stand Sit to Stand: Mod assist;+2 safety/equipment         General transfer comment: Heavy mod +1 to power-up to full stand. Pt required  increased time to schieve full stand and gain/maintain standing balance.   Ambulation/Gait Ambulation/Gait assistance: Min assist;Mod assist;+2 physical assistance;+2 safety/equipment Gait Distance (Feet): 50 Feet(25'+25') Assistive device: Rolling walker (2 wheeled) Gait Pattern/deviations: Step-through pattern;Decreased stride length;Ataxic;Narrow base of support Gait velocity: Decreased Gait velocity interpretation: <1.8 ft/sec, indicate of risk for recurrent falls General Gait Details: Very slow and guarded. Pt with flexed trunk and difficulty elevating head for forward gaze.    Stairs             Wheelchair Mobility    Modified Rankin (Stroke Patients Only)       Balance Overall balance assessment: Needs assistance Sitting-balance support: Bilateral upper extremity supported;Feet supported Sitting balance-Leahy Scale: Poor Sitting balance - Comments: pt supporting self with hands behind himselt into strong posterior lean Postural control: Posterior lean Standing balance support: Bilateral upper extremity supported Standing balance-Leahy Scale: Poor Standing balance comment: dependent on RW                            Cognition Arousal/Alertness: Awake/alert Behavior During Therapy: Anxious Overall Cognitive Status: Within Functional Limits for tasks assessed                                 General Comments: pt with decreased insight to safety however suspect this is baseline as he climbed a ladder despite having to use a cane for amb      Exercises      General Comments        Pertinent Vitals/Pain Pain Assessment: Faces Faces Pain Scale: Hurts even more  Pain Location: neck Pain Descriptors / Indicators: Sharp;Sore Pain Intervention(s): Monitored during session;Repositioned;Limited activity within patient's tolerance    Home Living Family/patient expects to be discharged to:: Inpatient rehab               Additional  Comments: pt lives in 1 story home with spouse, 3STE with no hand rail, walk in shower with handicapped height toliet    Prior Function Level of Independence: Needs assistance  Gait / Transfers Assistance Needed: used quad cane ADL's / Homemaking Assistance Needed: reports unable to don socks or clip toe nails Comments: pt was driving and up on ladders   PT Goals (current goals can now be found in the care plan section) Acute Rehab PT Goals Patient Stated Goal: to get back to normal  PT Goal Formulation: With patient Time For Goal Achievement: 11/02/18 Potential to Achieve Goals: Good Progress towards PT goals: Progressing toward goals    Frequency    Min 5X/week      PT Plan Current plan remains appropriate    Co-evaluation              AM-PAC PT "6 Clicks" Mobility   Outcome Measure  Help needed turning from your back to your side while in a flat bed without using bedrails?: A Lot Help needed moving from lying on your back to sitting on the side of a flat bed without using bedrails?: A Lot Help needed moving to and from a bed to a chair (including a wheelchair)?: A Lot Help needed standing up from a chair using your arms (e.g., wheelchair or bedside chair)?: A Lot Help needed to walk in hospital room?: A Lot Help needed climbing 3-5 steps with a railing? : A Lot 6 Click Score: 12    End of Session Equipment Utilized During Treatment: Gait belt Activity Tolerance: Patient tolerated treatment well Patient left: in chair;with call bell/phone within reach;with chair alarm set Nurse Communication: Mobility status;Other (comment)(coughing with thin liquids) PT Visit Diagnosis: Unsteadiness on feet (R26.81);Muscle weakness (generalized) (M62.81);Difficulty in walking, not elsewhere classified (R26.2)     Time: 1011-1040 PT Time Calculation (min) (ACUTE ONLY): 29 min  Charges:  $Gait Training: 23-37 mins                     Conni SlipperLaura Khaleah Duer, PT, DPT Acute  Rehabilitation Services Pager: [phone] Office: [phone]    Marylynn PearsonLaura D Gilda Abboud 10/20/2018, 1:49 PM

## 2018-10-20 NOTE — Progress Notes (Signed)
Pt presents with weak cough after drinking thin liquids from a straw  Pt was sitting up right in chair and taking small sips  Advised MD via secure chat  Will continue to monitor

## 2018-10-20 NOTE — Progress Notes (Signed)
Neurosurgery Service Progress Note  Subjective: No acute events overnight, hand numbness and weakness improving  Objective: Vitals:   10/19/18 1941 10/19/18 2313 10/20/18 0325 10/20/18 0700  BP: (!) 142/66 137/62 (!) 141/60 (!) 122/56  Pulse: 81 76 75 69  Resp: 15 17 15 16   Temp: 97.6 F (36.4 C) 98.3 F (36.8 C) 97.7 F (36.5 C) (!) 97.5 F (36.4 C)  TempSrc: Oral Oral Oral Oral  SpO2: 97% 95% 99% 96%  Weight:      Height:       Temp (24hrs), Avg:97.9 F (36.6 C), Min:97.5 F (36.4 C), Max:98.5 F (36.9 C)  CBC Latest Ref Rng & Units 10/18/2018 10/18/2018 10/17/2018  WBC 4.0 - 10.5 K/uL 12.3(H) - 11.1(H)  Hemoglobin 13.0 - 17.0 g/dL 12.1(L) 12.6(L) 14.7  Hematocrit 39.0 - 52.0 % 36.2(L) 37.0(L) 43.5  Platelets 150 - 400 K/uL 198 - 198   BMP Latest Ref Rng & Units 10/18/2018 10/17/2018  Glucose 70 - 99 mg/dL 161(W130(H) 960(A107(H)  BUN 8 - 23 mg/dL - 15  Creatinine 5.400.61 - 1.24 mg/dL 9.811.08 1.910.81  Sodium 478135 - 145 mmol/L 137 136  Potassium 3.5 - 5.1 mmol/L 4.0 4.0  Chloride 98 - 111 mmol/L - 101  CO2 22 - 32 mmol/L - 26  Calcium 8.9 - 10.3 mg/dL - 9.3    Intake/Output Summary (Last 24 hours) at 10/20/2018 1119 Last data filed at 10/20/2018 1017 Gross per 24 hour  Intake 938.17 ml  Output 1145 ml  Net -206.83 ml    Current Facility-Administered Medications:  .  acetaminophen (TYLENOL) tablet 650 mg, 650 mg, Oral, Q4H PRN **OR** acetaminophen (TYLENOL) suppository 650 mg, 650 mg, Rectal, Q4H PRN, Delfina Schreurs A, MD .  docusate sodium (COLACE) capsule 100 mg, 100 mg, Oral, BID, Ettamae Barkett A, MD, 100 mg at 10/20/18 [id number] .  famotidine (PEPCID) tablet 20 mg, 20 mg, Oral, BID, Jadene Pierinistergard, Tavin Vernet A, MD, 20 mg at 10/20/18 0821 .  heparin injection 5,000 Units, 5,000 Units, Subcutaneous, Q8H, Jadene Pierinistergard, Countess Biebel A, MD, 5,000 Units at 10/20/18 0558 .  HYDROmorphone (DILAUDID) injection 0.5 mg, 0.5 mg, Intravenous, Q3H PRN, Jadene Pierinistergard, Jahmeir Geisen A, MD, 0.5 mg at 10/19/18 1518 .  lactated  ringers infusion, , Intravenous, Continuous, Arta Brucessey, Kevin, MD, Last Rate: 10 mL/hr at 10/18/18 1201 .  ondansetron (ZOFRAN) tablet 4 mg, 4 mg, Oral, Q4H PRN **OR** ondansetron (ZOFRAN) injection 4 mg, 4 mg, Intravenous, Q4H PRN, Adwoa Axe A, MD .  oxyCODONE (Oxy IR/ROXICODONE) immediate release tablet 10 mg, 10 mg, Oral, Q4H PRN, Jadene Pierinistergard, Alba Kriesel A, MD, 10 mg at 10/20/18 0347 .  oxyCODONE (Oxy IR/ROXICODONE) immediate release tablet 5 mg, 5 mg, Oral, Q4H PRN, Jadene Pierinistergard, Lynden Flemmer A, MD, 5 mg at 10/20/18 [id number] .  polyethylene glycol (MIRALAX / GLYCOLAX) packet 17 g, 17 g, Oral, Daily PRN, Jadene Pierinistergard, Ritesh Opara A, MD, 17 g at 10/19/18 2142 .  promethazine (PHENERGAN) tablet 12.5-25 mg, 12.5-25 mg, Oral, Q4H PRN, Jadene Pierinistergard, Melea Prezioso A, MD   Physical Exam: AOx3, PERRL, EOMI, FS, Strength 4+/5 in BUE and BLE, SILTx4 except for mild hand numbness, no drift Incisions c/d/i Drain w/ serosang output  Assessment & Plan: 68 y.o. man with AS s/p 3 column unstable cervical spine fracture s/p anterior cervical corpectomy and posterior instrumented fusion, recovering well. Post-op XR show hardware intact, good alignment  -exam improving -activity as tolerated, no collar needed -PT/OT recommending CIR, PM&R consulted, if appropriate for CIR then okay for transfer to CIR when bed is available -SCDs/TEDs,  SQH  -drain significantly decreased, d/c drain -SLP for swallow eval, having odynophagia, normal voice, neck soft, no stridor or difficulty breathing  Judith Part  10/20/18 11:19 AM

## 2018-10-20 NOTE — Progress Notes (Signed)
Speech therapy recommended NPO diet and re evaluate pt tomorrow  Secure message sent to MD to inform  MD aware

## 2018-10-20 NOTE — Progress Notes (Signed)
Pt daughter in law picked up pt belongings from safe with pt permission  AD aware as well

## 2018-10-20 NOTE — Progress Notes (Signed)
Pt daughter in law brought pt phone to bedside and took partial plate and wallet home

## 2018-10-20 NOTE — Progress Notes (Addendum)
Inpatient Rehabilitation Admissions Coordinator  Inpatient Rehab Consult received. I met with patient at the bedside for rehabilitation assessment. We discussed goals and expectations of an inpatient rehab admission.  I will contact his wife to further discuss his eventual needs once discharged home. Daughter in [place] in to visit, so I will return later to continue my rehab consultation and recommendations.  Danne Baxter, RN, MSN Rehab Admissions Coordinator [phone] 10/20/2018 12:41 PM   I spoke with pt's wife via phone and she is in agreement to inpt rehab admit. I will follow up tomorrow.  Danne Baxter, RN, MSN Rehab Admissions Coordinator [phone] 10/20/2018 4:00 PM

## 2018-10-20 NOTE — Progress Notes (Signed)
MD stated to place NPO order and start NS at 38ml/hr  Informed pt on plan of care  Pt understanding

## 2018-10-20 NOTE — Evaluation (Signed)
Clinical/Bedside Swallow Evaluation Patient Details  Name: Travis Hart MRN: [id number] Date of Birth: 1950-12-22  Today's Date: 10/20/2018 Time: SLP Start Time (ACUTE ONLY): 7829 SLP Stop Time (ACUTE ONLY): 1550 SLP Time Calculation (min) (ACUTE ONLY): 19 min  Past Medical History: History reviewed. No pertinent past medical history. Past Surgical History:  Past Surgical History:  Procedure Laterality Date  . ANTERIOR CERVICAL DECOMP/DISCECTOMY FUSION N/A 10/18/2018   Procedure: CERVICAL CORPECTOMY CERVICAL SIX;  Surgeon: Judith Part, MD;  Location: [place];  Service: Neurosurgery;  Laterality: N/A;  . POSTERIOR CERVICAL FUSION/FORAMINOTOMY N/A 10/18/2018   Procedure: POSTERIOR CERVICAL FUSION/FORAMINOTOMY  CERVICAL FOUR-THORACIC ONE;  Surgeon: Judith Part, MD;  Location: [place];  Service: Neurosurgery;  Laterality: N/A;   HPI:   68 y.o. man s/p fall with neck pain and symptoms of instability with hand and numbness. CT C-spine confirmed fracture line through C6 vertebral body into the C5-6 disc space with fracture of the C6-7 posterior elements. Pt s/p C5-6 ACDF followed by C4-T1 posterior instrumented fusion on 7/5. PMH: arthritis, ankylosing spondylitis, freq hip dislocations.   Assessment / Plan / Recommendation Clinical Impression  Pt has subjective c/o difficulty swallowing, sharing that he has been using his yankauer throughout the day to suction secretions from his mouth. Pt was provided small boluses of various viscosities with consistent coughing that follows. Pt expectorated moderate amounts of thin liquids likely mixed also with secretions. Applesauce was also coughing back up into his mouth and then suctioned out. Recommend that pt remain NPO pending completion of MBS, which could be done on next date at the earliest. Discussed with RN, who reached out to MD with recommendations. SLP Visit Diagnosis: Dysphagia, unspecified (R13.10)    Aspiration Risk  Moderate  aspiration risk    Diet Recommendation NPO   Medication Administration: Via alternative means    Other  Recommendations Oral Care Recommendations: Oral care QID Other Recommendations: Have oral suction available   Follow up Recommendations Inpatient Rehab      Frequency and Duration            Prognosis Prognosis for Safe Diet Advancement: Good      Swallow Study   General HPI:  68 y.o. man s/p fall with neck pain and symptoms of instability with hand and numbness. CT C-spine confirmed fracture line through C6 vertebral body into the C5-6 disc space with fracture of the C6-7 posterior elements. Pt s/p C5-6 ACDF followed by C4-T1 posterior instrumented fusion on 7/5. PMH: arthritis, ankylosing spondylitis, freq hip dislocations. Type of Study: Bedside Swallow Evaluation Previous Swallow Assessment: none in chart Diet Prior to this Study: Dysphagia 3 (soft);Thin liquids Temperature Spikes Noted: No Respiratory Status: Room air History of Recent Intubation: No Behavior/Cognition: Alert;Cooperative;Pleasant mood Oral Cavity Assessment: Within Functional Limits Oral Care Completed by SLP: No Oral Cavity - Dentition: Adequate natural dentition Vision: Functional for self-feeding Self-Feeding Abilities: Able to feed self Patient Positioning: Upright in bed(use of Trendelenburg) Baseline Vocal Quality: Wet Volitional Cough: Strong Volitional Swallow: Unable to elicit    Oral/Motor/Sensory Function Overall Oral Motor/Sensory Function: Within functional limits   Ice Chips Ice chips: Impaired Presentation: Spoon Pharyngeal Phase Impairments: Cough - Delayed   Thin Liquid Thin Liquid: Impaired Presentation: Cup;Self Fed Pharyngeal  Phase Impairments: Cough - Immediate    Nectar Thick Nectar Thick Liquid: Not tested   Honey Thick Honey Thick Liquid: Not tested   Puree Puree: Impaired Presentation: Spoon Pharyngeal Phase Impairments: Cough - Delayed   Solid  Solid: Not  tested      Virl AxeLaura P Apryl Brymer 10/20/2018,4:25 PM  Ivar DrapeLaura Angelito Hopping, M.A. CCC-SLP Acute Herbalistehabilitation Services Pager [phone] Office [phone]

## 2018-10-21 ENCOUNTER — Inpatient Hospital Stay (HOSPITAL_COMMUNITY): Payer: Medicare Other

## 2018-10-21 LAB — GLUCOSE, CAPILLARY
Glucose-Capillary: 104 mg/dL — ABNORMAL HIGH (ref 70–99)
Glucose-Capillary: 118 mg/dL — ABNORMAL HIGH (ref 70–99)
Glucose-Capillary: 147 mg/dL — ABNORMAL HIGH (ref 70–99)

## 2018-10-21 MED ORDER — INSULIN ASPART 100 UNIT/ML ~~LOC~~ SOLN
0.0000 [IU] | Freq: Three times a day (TID) | SUBCUTANEOUS | Status: DC
  Administered 2018-10-22: 2 [IU] via SUBCUTANEOUS
  Administered 2018-10-22 – 2018-10-23 (×2): 1 [IU] via SUBCUTANEOUS

## 2018-10-21 MED ORDER — DEXAMETHASONE SODIUM PHOSPHATE 4 MG/ML IJ SOLN
4.0000 mg | Freq: Four times a day (QID) | INTRAMUSCULAR | Status: DC
  Administered 2018-10-21 – 2018-10-23 (×9): 4 mg via INTRAVENOUS
  Filled 2018-10-21 (×9): qty 1

## 2018-10-21 MED ORDER — FAMOTIDINE IN NACL 20-0.9 MG/50ML-% IV SOLN
20.0000 mg | INTRAVENOUS | Status: DC
  Administered 2018-10-21: 20 mg via INTRAVENOUS
  Filled 2018-10-21 (×2): qty 50

## 2018-10-21 NOTE — Progress Notes (Signed)
Inpatient Rehabilitation Admissions Coordinator  I await progress with his dysphagia to be able to admit to inpt rehab. Hopeful in next 24 to 48 hrs. I have updated pt at bedside . I spoke with his wife by phone yesterday and she is in agreement to CIR admit when medically ready.  Danne Baxter, RN, MSN Rehab Admissions Coordinator [phone] 10/21/2018 12:17 PM

## 2018-10-21 NOTE — Care Management Important Message (Signed)
Important Message  Patient Details  Name: Travis Hart MRN: [id number] Date of Birth: 17-Jan-1951   Medicare Important Message Given:  Yes     Orbie Pyo 10/21/2018, 1:27 PM

## 2018-10-21 NOTE — Progress Notes (Signed)
Neurosurgery Service Progress Note  Subjective: No acute events overnight, hand numbness and weakness improving, dysphagia worsening, no difficulty breathing or stridor  Objective: Vitals:   10/20/18 1919 10/20/18 2314 10/21/18 0327 10/21/18 0804  BP: 122/64 127/61 (!) 143/64 132/63  Pulse: 76 77 77 72  Resp: 18 18 18 20   Temp: 98.5 F (36.9 C) 98.3 F (36.8 C) 97.8 F (36.6 C) 98.2 F (36.8 C)  TempSrc: Oral Oral Oral Oral  SpO2: 93% 95% 94% 95%  Weight:      Height:       Temp (24hrs), Avg:98.1 F (36.7 C), Min:97.7 F (36.5 C), Max:98.5 F (36.9 C)  CBC Latest Ref Rng & Units 10/18/2018 10/18/2018 10/17/2018  WBC 4.0 - 10.5 K/uL 12.3(H) - 11.1(H)  Hemoglobin 13.0 - 17.0 g/dL 12.1(L) 12.6(L) 14.7  Hematocrit 39.0 - 52.0 % 36.2(L) 37.0(L) 43.5  Platelets 150 - 400 K/uL 198 - 198   BMP Latest Ref Rng & Units 10/18/2018 10/17/2018  Glucose 70 - 99 mg/dL 130(H) 107(H)  BUN 8 - 23 mg/dL - 15  Creatinine 0.61 - 1.24 mg/dL 1.08 0.81  Sodium 135 - 145 mmol/L 137 136  Potassium 3.5 - 5.1 mmol/L 4.0 4.0  Chloride 98 - 111 mmol/L - 101  CO2 22 - 32 mmol/L - 26  Calcium 8.9 - 10.3 mg/dL - 9.3    Intake/Output Summary (Last 24 hours) at 10/21/2018 0806 Last data filed at 10/21/2018 0600 Gross per 24 hour  Intake 1053.52 ml  Output 1380 ml  Net -326.48 ml    Current Facility-Administered Medications:  .  0.9 %  sodium chloride infusion, , Intravenous, Continuous, Judith Part, MD, Last Rate: 75 mL/hr at 10/21/18 0649 .  acetaminophen (TYLENOL) tablet 650 mg, 650 mg, Oral, Q4H PRN **OR** acetaminophen (TYLENOL) suppository 650 mg, 650 mg, Rectal, Q4H PRN, Anaise Sterbenz A, MD .  dexamethasone (DECADRON) injection 4 mg, 4 mg, Intravenous, Q6H, Xin Klawitter A, MD .  docusate sodium (COLACE) capsule 100 mg, 100 mg, Oral, BID, Ellakate Gonsalves A, MD, 100 mg at 10/20/18 3474 .  famotidine (PEPCID) IVPB 20 mg premix, 20 mg, Intravenous, Q24H, Ellina Sivertsen A, MD .  heparin  injection 5,000 Units, 5,000 Units, Subcutaneous, Q8H, Judith Part, MD, 5,000 Units at 10/21/18 0556 .  HYDROmorphone (DILAUDID) injection 0.5 mg, 0.5 mg, Intravenous, Q3H PRN, Judith Part, MD, 0.5 mg at 10/21/18 0326 .  insulin aspart (novoLOG) injection 0-9 Units, 0-9 Units, Subcutaneous, TID WC, Zayn Selley, Joyice Faster, MD .  lactated ringers infusion, , Intravenous, Continuous, Lillia Abed, MD, Last Rate: 10 mL/hr at 10/18/18 1201 .  MEDLINE mouth rinse, 15 mL, Mouth Rinse, BID, Jaeline Whobrey A, MD .  ondansetron (ZOFRAN) tablet 4 mg, 4 mg, Oral, Q4H PRN **OR** ondansetron (ZOFRAN) injection 4 mg, 4 mg, Intravenous, Q4H PRN, Aireal Slater A, MD .  oxyCODONE (Oxy IR/ROXICODONE) immediate release tablet 10 mg, 10 mg, Oral, Q4H PRN, Judith Part, MD, 10 mg at 10/20/18 0347 .  oxyCODONE (Oxy IR/ROXICODONE) immediate release tablet 5 mg, 5 mg, Oral, Q4H PRN, Judith Part, MD, 5 mg at 10/20/18 2595 .  polyethylene glycol (MIRALAX / GLYCOLAX) packet 17 g, 17 g, Oral, Daily PRN, Judith Part, MD, 17 g at 10/19/18 2142 .  promethazine (PHENERGAN) tablet 12.5-25 mg, 12.5-25 mg, Oral, Q4H PRN, Judith Part, MD   Physical Exam: AOx3, PERRL, EOMI, FS, Strength 4+/5 in BUE and BLE, SILTx4 except for mild hand numbness, no drift  Incisions c/d/i  Assessment & Plan: 68 y.o. man with AS s/p 3 column unstable cervical spine fracture s/p anterior cervical corpectomy and posterior instrumented fusion, recovering well. Post-op XR show hardware intact, good alignment  Neuro: -exam improving -activity as tolerated, no collar needed  Cardiopulm: no issues  FENGI - post-op dysphagia -started dex IV 4q6 x3d, insulin sliding scale, IV H2 blocker until he can tolerate po -failed bedside swallow, likely MBS, f/u SLP recs  Heme/ID no issues  PPx/Dispo -SCDs/TEDs, SQH  -PT/OT recommending CIR, PM&R consulted, likely will require dysphagia to be resolved before  transfer   Jadene Pierinihomas A Bebe Moncure  10/21/18 8:06 AM

## 2018-10-21 NOTE — Progress Notes (Signed)
Modified Barium Swallow Progress Note  Patient Details  Name: Travis Hart MRN: [id number] Date of Birth: March 15, 1951  Today's Date: 10/21/2018  Modified Barium Swallow completed.  Full report located under Chart Review in the Imaging Section.  Brief recommendations include the following:  Clinical Impression  Pt has a mild-moderate pharyngeal dysphagia from what appears to be edema from recent surgery that results in protrusion of the posterior pharyngeal wall, limiting epiglottic inversion. With incomplete airway closure, pt does have consistent entrance into the laryngeal vestibule regardless of consistency attempted; however, thin liquids penetrate transiently, clearing either upon completion of the swallow or as he performs subsequent swallows. There was a single incidence of aspiration with a larger sip, but it was trace and pt had an immediate cough response that ejected it out of the airway and then up and out his oral cavity. Penetration with solids does not clear quite as promptly, although it also does not reach the vocal folds. He has increased vallecular residue as boluses become more solid. Given potential limitations in positioning when pt is eating meals, recommend starting with a full liquid diet today, using small, single boluses to reduce risk of aspiration. Instructed pt to monitor for any coughing, which is likely a sign that he is either getting too large of sips or that he is not upright enough. Anticiapte good prognosis for advancement to more solids with increased time.   Swallow Evaluation Recommendations       SLP Diet Recommendations: Thin liquid   Liquid Administration via: Cup;Straw   Medication Administration: Crushed with puree   Supervision: Patient able to self feed;Intermittent supervision to cue for compensatory strategies   Compensations: Slow rate;Small sips/bites   Postural Changes: Seated upright at 90 degrees   Oral Care Recommendations: Oral  care BID   Other Recommendations: Have oral suction available    Travis Hart 10/21/2018,10:48 AM   Pollyann Glen, M.A. [facility] Acute Environmental education officer [phone] Office [phone]

## 2018-10-21 NOTE — Progress Notes (Signed)
Physical Therapy Treatment Patient Details Name: Travis Hart MRN: [id number] DOB: 02-11-1951 Today's Date: 10/21/2018    History of Present Illness  68 y.o. man s/p fall with neck pain and symptoms of instability with hand and numbness. CT C-spine confirmed fracture line through C6 vertebral body into the C5-6 disc space with fracture of the C6-7 posterior elements. Pt s/p C5-6 ACDF followed by C4-T1 posterior instrumented fusion on 7/5. PMH: arthritis, ankylosing spondylitis, freq hip dislocations.    PT Comments    Patient seen for mobility progression. Continue to recommend CIR level therapies to maximize independence and safety with mobility.    Follow Up Recommendations  CIR     Equipment Recommendations  (TBD)    Recommendations for Other Services       Precautions / Restrictions Precautions Precautions: Cervical Precaution Booklet Issued: No Precaution Comments: Verbally reviewed precautions during functional mobility.  Restrictions Weight Bearing Restrictions: No    Mobility  Bed Mobility Overal bed mobility: Needs Assistance Bed Mobility: Rolling;Sidelying to Sit Rolling: Min guard Sidelying to sit: Min assist       General bed mobility comments: assist to elevate trunk into sitting   Transfers Overall transfer level: Needs assistance Equipment used: Rolling walker (2 wheeled) Transfers: Sit to/from Stand Sit to Stand: Mod assist         General transfer comment: assist to power up into standing  Ambulation/Gait Ambulation/Gait assistance: Min assist;+2 safety/equipment;Mod assist Gait Distance (Feet): (80 ft then 70 ft with seated rest break) Assistive device: Rolling walker (2 wheeled) Gait Pattern/deviations: Step-through pattern;Narrow base of support;Decreased stride length;Decreased step length - left;Decreased weight shift to right;Ataxic;Trunk flexed Gait velocity: Decreased   General Gait Details: cues for posture/forward gaze and safe  use of AD   Stairs             Wheelchair Mobility    Modified Rankin (Stroke Patients Only)       Balance Overall balance assessment: Needs assistance Sitting-balance support: Bilateral upper extremity supported;Feet supported Sitting balance-Leahy Scale: Poor   Postural control: Posterior lean Standing balance support: Bilateral upper extremity supported Standing balance-Leahy Scale: Poor                              Cognition Arousal/Alertness: Awake/alert Behavior During Therapy: WFL for tasks assessed/performed Overall Cognitive Status: Within Functional Limits for tasks assessed                                 General Comments: pt with decreased insight to safety however suspect this is baseline as he climbed a ladder despite having to use a cane for amb      Exercises      General Comments        Pertinent Vitals/Pain Pain Assessment: Faces Faces Pain Scale: Hurts a little bit Pain Location: generalized; L hand numbness Pain Descriptors / Indicators: Sore Pain Intervention(s): Limited activity within patient's tolerance;Monitored during session;Repositioned    Home Living                      Prior Function            PT Goals (current goals can now be found in the care plan section) Acute Rehab PT Goals Patient Stated Goal: to get back to normal  Progress towards PT goals: Progressing toward goals  Frequency    Min 5X/week      PT Plan Current plan remains appropriate    Co-evaluation              AM-PAC PT "6 Clicks" Mobility   Outcome Measure  Help needed turning from your back to your side while in a flat bed without using bedrails?: A Lot Help needed moving from lying on your back to sitting on the side of a flat bed without using bedrails?: A Lot Help needed moving to and from a bed to a chair (including a wheelchair)?: A Lot Help needed standing up from a chair using your arms  (e.g., wheelchair or bedside chair)?: A Lot Help needed to walk in hospital room?: A Lot Help needed climbing 3-5 steps with a railing? : A Lot 6 Click Score: 12    End of Session Equipment Utilized During Treatment: Gait belt Activity Tolerance: Patient tolerated treatment well Patient left: in chair;with call bell/phone within reach;with chair alarm set Nurse Communication: Mobility status PT Visit Diagnosis: Unsteadiness on feet (R26.81);Muscle weakness (generalized) (M62.81);Difficulty in walking, not elsewhere classified (R26.2)     Time: [id number] PT Time Calculation (min) (ACUTE ONLY): 23 min  Charges:  $Gait Training: 23-37 mins                     Erline LevineKellyn Amrie Gurganus, PTA Acute Rehabilitation Services Pager: [phone] Office: [phone]     Carolynne EdouardKellyn R Rosendo Couser 10/21/2018, 4:12 PM

## 2018-10-22 LAB — HEMOGLOBIN A1C
Hgb A1c MFr Bld: 5.7 % — ABNORMAL HIGH (ref 4.8–5.6)
Mean Plasma Glucose: 117 mg/dL

## 2018-10-22 LAB — GLUCOSE, CAPILLARY
Glucose-Capillary: 151 mg/dL — ABNORMAL HIGH (ref 70–99)
Glucose-Capillary: 107 mg/dL — ABNORMAL HIGH (ref 70–99)
Glucose-Capillary: 126 mg/dL — ABNORMAL HIGH (ref 70–99)
Glucose-Capillary: 166 mg/dL — ABNORMAL HIGH (ref 70–99)

## 2018-10-22 MED ORDER — FAMOTIDINE 20 MG PO TABS
20.0000 mg | ORAL_TABLET | Freq: Every day | ORAL | Status: DC
  Administered 2018-10-22 – 2018-10-23 (×2): 20 mg via ORAL
  Filled 2018-10-22 (×2): qty 1

## 2018-10-22 NOTE — Progress Notes (Signed)
Physical Therapy Treatment Patient Details Name: Travis Hart MRN: [id number] DOB: 1951/03/07 Today's Date: 10/22/2018    History of Present Illness  68 y.o. man s/p fall with neck pain and symptoms of instability with hand and numbness. CT C-spine confirmed fracture line through C6 vertebral body into the C5-6 disc space with fracture of the C6-7 posterior elements. Pt s/p C5-6 ACDF followed by C4-T1 posterior instrumented fusion on 7/5. PMH: arthritis, ankylosing spondylitis, freq hip dislocations.    PT Comments    Pt progressing towards physical therapy goals. He was able to progress ambulation distance, however required a standing rest break 2 3/4 dyspnea. VSS with O2 at 91% on RA and HR at 110 bpm. Will continue to follow and progress as able per POC.    Follow Up Recommendations  CIR     Equipment Recommendations  (TBD)    Recommendations for Other Services Rehab consult     Precautions / Restrictions Precautions Precautions: Cervical Precaution Booklet Issued: Yes (comment) Precaution Comments: Verbally reviewed precautions during functional mobility. Precaution sheet provided at end of session.  Restrictions Weight Bearing Restrictions: No    Mobility  Bed Mobility Overal bed mobility: Needs Assistance Bed Mobility: Rolling;Sidelying to Sit Rolling: Min guard Sidelying to sit: Min assist;Mod assist       General bed mobility comments: VC's for log roll technique. Pt required increased time and hand-over-hand support to reach for rails. Min-mod assist required to elevate trunk into full sitting position.   Transfers Overall transfer level: Needs assistance Equipment used: Rolling walker (2 wheeled) Transfers: Sit to/from Stand Sit to Stand: Mod assist         General transfer comment: VC's not to scoot too far forward prior to initiating sit>stand. VC's for hand placement on seated surface for safety and assist to power-up to full standing position.    Ambulation/Gait Ambulation/Gait assistance: Min assist;+2 safety/equipment Gait Distance (Feet): 200 Feet(150', standing rest, 50') Assistive device: Rolling walker (2 wheeled) Gait Pattern/deviations: Step-through pattern;Narrow base of support;Decreased stride length;Decreased step length - left;Decreased weight shift to right;Ataxic;Trunk flexed Gait velocity: Decreased Gait velocity interpretation: <1.8 ft/sec, indicate of risk for recurrent falls General Gait Details: VC's for improved posture/forward gaze, closer walker proximity, and increased heel strike. Chair follow utilized however pt refused to take a seated rest break. Therapist encouraged standing rest break as pt dyspneic. O2 91% on RA and HR 110 at this time.    Stairs             Wheelchair Mobility    Modified Rankin (Stroke Patients Only)       Balance Overall balance assessment: Needs assistance Sitting-balance support: Bilateral upper extremity supported;Feet supported Sitting balance-Leahy Scale: Poor   Postural control: Posterior lean Standing balance support: Bilateral upper extremity supported Standing balance-Leahy Scale: Poor Standing balance comment: dependent on RW                            Cognition Arousal/Alertness: Awake/alert Behavior During Therapy: WFL for tasks assessed/performed Overall Cognitive Status: Within Functional Limits for tasks assessed                                        Exercises      General Comments        Pertinent Vitals/Pain Pain Assessment: Faces Faces Pain Scale: Hurts a little  bit Pain Location: generalized; L hand numbness Pain Descriptors / Indicators: Sore Pain Intervention(s): Monitored during session    Home Living                      Prior Function            PT Goals (current goals can now be found in the care plan section) Acute Rehab PT Goals Patient Stated Goal: "Not to need rehab" PT Goal  Formulation: With patient Time For Goal Achievement: 11/02/18 Potential to Achieve Goals: Good Progress towards PT goals: Progressing toward goals    Frequency    Min 5X/week      PT Plan Current plan remains appropriate    Co-evaluation              AM-PAC PT "6 Clicks" Mobility   Outcome Measure  Help needed turning from your back to your side while in a flat bed without using bedrails?: A Lot Help needed moving from lying on your back to sitting on the side of a flat bed without using bedrails?: A Lot Help needed moving to and from a bed to a chair (including a wheelchair)?: A Lot Help needed standing up from a chair using your arms (e.g., wheelchair or bedside chair)?: A Lot Help needed to walk in hospital room?: A Lot Help needed climbing 3-5 steps with a railing? : A Lot 6 Click Score: 12    End of Session Equipment Utilized During Treatment: Gait belt Activity Tolerance: Patient tolerated treatment well Patient left: in chair;with call bell/phone within reach;with chair alarm set Nurse Communication: Mobility status PT Visit Diagnosis: Unsteadiness on feet (R26.81);Muscle weakness (generalized) (M62.81);Difficulty in walking, not elsewhere classified (R26.2)     Time: [id number] PT Time Calculation (min) (ACUTE ONLY): 26 min  Charges:  $Gait Training: 23-37 mins                     Conni SlipperLaura Kawehi Hostetter, PT, DPT Acute Rehabilitation Services Pager: [phone] Office: [phone]    Marylynn PearsonLaura D Adyn Serna 10/22/2018, 12:31 PM

## 2018-10-22 NOTE — Progress Notes (Signed)
  Speech Language Pathology Treatment: Dysphagia  Patient Details Name: Travis Hart MRN: [id number] DOB: March 25, 1951 Today's Date: 10/22/2018 Time: 9833-8250 SLP Time Calculation (min) (ACUTE ONLY): 23 min  Assessment / Plan / Recommendation Clinical Impression  SLP assessed with upgraded PO textures including regular consistencies. Pt states he feels as though swallow function continues to improve each day. Reinforced safe swallowing strategies and education regarding s/sx of possible reduced airway protection (coughing, changes in vocal quality). Pt with intermittent delayed cough and delayed throat clear however voice remained clear. Recommend diet advancement to dysphagia 2 (finely chopped) and thin liquids with medicines crushed in puree. SLP to follow up during acute stay     HPI HPI:  68 y.o. man s/p fall with neck pain and symptoms of instability with hand and numbness. CT C-spine confirmed fracture line through C6 vertebral body into the C5-6 disc space with fracture of the C6-7 posterior elements. Pt s/p C5-6 ACDF followed by C4-T1 posterior instrumented fusion on 7/5. PMH: arthritis, ankylosing spondylitis, freq hip dislocations.      SLP Plan  Continue with current plan of care       Recommendations  Diet recommendations: Dysphagia 2 (fine chop);Thin liquid Liquids provided via: Cup;Straw Medication Administration: Crushed with puree Supervision: Intermittent supervision to cue for compensatory strategies Compensations: Slow rate;Small sips/bites;Effortful swallow;Multiple dry swallows after each bite/sip;Follow solids with liquid;Clear throat intermittently Postural Changes and/or Swallow Maneuvers: Seated upright 90 degrees;Upright 30-60 min after meal                Oral Care Recommendations: Oral care BID Follow up Recommendations: Inpatient Rehab SLP Visit Diagnosis: Dysphagia, pharyngeal phase (R13.13) Plan: Continue with current plan of care        Mathews MA, [place] 10/22/2018, 8:33 AM

## 2018-10-22 NOTE — Progress Notes (Signed)
Inpatient Rehabilitation Admissions Coordinator  I met with patient at bedside after therapy. I do not have a bed available to admit pt to today. He is progressing well functionally. I explained that I would not admit pt to CIR if it was felt he needed only a 2 to 3 day stay. I will reassess his need tomorrow when beds are expected to be available over the next 24 to 48 hrs.  Danne Baxter, RN, MSN Rehab Admissions Coordinator [phone] 10/22/2018 2:46 PM

## 2018-10-22 NOTE — Progress Notes (Signed)
Neurosurgery Service Progress Note  Subjective: No acute events overnight, dysphagia improving, hand numbness/function improving  Objective: Vitals:   10/21/18 1613 10/21/18 1911 10/21/18 2346 10/22/18 0432  BP: 135/65 130/65 102/74 134/68  Pulse: 70 75 68 63  Resp: 20 18 16 17   Temp: 98.6 F (37 C) 98.9 F (37.2 C) (!) 97.5 F (36.4 C) (!) 97.5 F (36.4 C)  TempSrc: Oral Oral Oral Oral  SpO2: 92% 93% 93% 97%  Weight:      Height:       Temp (24hrs), Avg:98 F (36.7 C), Min:97.5 F (36.4 C), Max:98.9 F (37.2 C)  CBC Latest Ref Rng & Units 10/18/2018 10/18/2018 10/17/2018  WBC 4.0 - 10.5 K/uL 12.3(H) - 11.1(H)  Hemoglobin 13.0 - 17.0 g/dL 12.1(L) 12.6(L) 14.7  Hematocrit 39.0 - 52.0 % 36.2(L) 37.0(L) 43.5  Platelets 150 - 400 K/uL 198 - 198   BMP Latest Ref Rng & Units 10/18/2018 10/17/2018  Glucose 70 - 99 mg/dL 130(H) 107(H)  BUN 8 - 23 mg/dL - 15  Creatinine 0.61 - 1.24 mg/dL 1.08 0.81  Sodium 135 - 145 mmol/L 137 136  Potassium 3.5 - 5.1 mmol/L 4.0 4.0  Chloride 98 - 111 mmol/L - 101  CO2 22 - 32 mmol/L - 26  Calcium 8.9 - 10.3 mg/dL - 9.3    Intake/Output Summary (Last 24 hours) at 10/22/2018 0825 Last data filed at 10/22/2018 0600 Gross per 24 hour  Intake 2758.48 ml  Output 500 ml  Net 2258.48 ml    Current Facility-Administered Medications:  .  0.9 %  sodium chloride infusion, , Intravenous, Continuous, Artur Winningham, Joyice Faster, MD, Last Rate: 75 mL/hr at 10/22/18 0736 .  acetaminophen (TYLENOL) tablet 650 mg, 650 mg, Oral, Q4H PRN **OR** acetaminophen (TYLENOL) suppository 650 mg, 650 mg, Rectal, Q4H PRN, Rhiannon Sassaman A, MD .  dexamethasone (DECADRON) injection 4 mg, 4 mg, Intravenous, Q6H, Militza Devery, Joyice Faster, MD, 4 mg at 10/22/18 0607 .  docusate sodium (COLACE) capsule 100 mg, 100 mg, Oral, BID, Judith Part, MD, 100 mg at 10/21/18 2224 .  famotidine (PEPCID) IVPB 20 mg premix, 20 mg, Intravenous, Q24H, Pepper Kerrick A, MD, Last Rate: 100 mL/hr at  10/21/18 1015, 20 mg at 10/21/18 1015 .  heparin injection 5,000 Units, 5,000 Units, Subcutaneous, Q8H, Judith Part, MD, 5,000 Units at 10/22/18 [phone] .  HYDROmorphone (DILAUDID) injection 0.5 mg, 0.5 mg, Intravenous, Q3H PRN, Judith Part, MD, 0.5 mg at 10/21/18 2222 .  insulin aspart (novoLOG) injection 0-9 Units, 0-9 Units, Subcutaneous, TID WC, Wilmont Olund, Joyice Faster, MD, 2 Units at 10/22/18 0200 .  lactated ringers infusion, , Intravenous, Continuous, Lillia Abed, MD, Last Rate: 10 mL/hr at 10/18/18 1201 .  MEDLINE mouth rinse, 15 mL, Mouth Rinse, BID, Freida Nebel, Joyice Faster, MD, 15 mL at 10/21/18 1017 .  ondansetron (ZOFRAN) tablet 4 mg, 4 mg, Oral, Q4H PRN **OR** ondansetron (ZOFRAN) injection 4 mg, 4 mg, Intravenous, Q4H PRN, Gyanna Jarema A, MD .  oxyCODONE (Oxy IR/ROXICODONE) immediate release tablet 10 mg, 10 mg, Oral, Q4H PRN, Judith Part, MD, 10 mg at 10/20/18 0347 .  oxyCODONE (Oxy IR/ROXICODONE) immediate release tablet 5 mg, 5 mg, Oral, Q4H PRN, Judith Part, MD, 5 mg at 10/20/18 6568 .  polyethylene glycol (MIRALAX / GLYCOLAX) packet 17 g, 17 g, Oral, Daily PRN, Judith Part, MD, 17 g at 10/19/18 2142 .  promethazine (PHENERGAN) tablet 12.5-25 mg, 12.5-25 mg, Oral, Q4H PRN, Judith Part, MD  Physical Exam: AOx3, PERRL, EOMI, FS, Strength 4+/5 in BUE and BLE, SILTx4 except for mild hand numbness, no drift Incisions c/d/i  Assessment & Plan: 68 y.o. man with AS s/p 3 column unstable cervical spine fracture s/p anterior cervical corpectomy and posterior instrumented fusion, recovering well. Post-op XR show hardware intact, good alignment  Neuro: -exam improving -activity as tolerated, no collar needed  Cardiopulm: no issues  FENGI - post-op dysphagia -dex IV 4q6 x2 more days, insulin sliding scale, switch to po H2 blocker -s/p MBS, was on liquids, but being seen by SLP right now and feels like his swallowing is much  better  Heme/ID no issues  PPx/Dispo -SCDs/TEDs, SQH  -PT/OT recommending CIR -now that he has a po diet, okay for CIR transfer today  Jadene Pierinihomas A Sundai Probert  10/22/18 8:25 AM

## 2018-10-23 LAB — GLUCOSE, CAPILLARY: Glucose-Capillary: 148 mg/dL — ABNORMAL HIGH (ref 70–99)

## 2018-10-23 MED ORDER — OXYCODONE HCL 5 MG PO TABS: 5.0000 mg | ORAL_TABLET | ORAL | 0 refills | Status: AC | PRN

## 2018-10-23 NOTE — Progress Notes (Signed)
Physical Therapy Treatment Patient Details Name: Travis Hart MRN: [id number] DOB: 05/29/1950 Today's Date: 10/23/2018    History of Present Illness  68 y.o. man s/p fall with neck pain and symptoms of instability with hand and numbness. CT C-spine confirmed fracture line through C6 vertebral body into the C5-6 disc space with fracture of the C6-7 posterior elements. Pt s/p C5-6 ACDF followed by C4-T1 posterior instrumented fusion on 7/5. PMH: arthritis, ankylosing spondylitis, freq hip dislocations.    PT Comments    Patient seen for mobility progression. Pt continues to make progress toward PT goals. Pt does require assistance for bed mobility while maintaining cervical precautions, min/mod A for functional transfer training, and min guard/min A for gait/stair training. Pt tolerated session well without c/o increased pain. Continue to recommend CIR level therapies to maximize independence and safety with mobility.    Follow Up Recommendations  CIR     Equipment Recommendations  (TBD)    Recommendations for Other Services       Precautions / Restrictions Precautions Precautions: Cervical Precaution Booklet Issued: Yes (comment) Precaution Comments: Verbally reviewed precautions during functional mobility.  Restrictions Weight Bearing Restrictions: No    Mobility  Bed Mobility Overal bed mobility: Needs Assistance Bed Mobility: Rolling;Sidelying to Sit Rolling: Min guard Sidelying to sit: Min assist       General bed mobility comments: VC's for log roll technique; assist to elevate trunk from flat bed  Transfers Overall transfer level: Needs assistance Equipment used: Rolling walker (2 wheeled) Transfers: Sit to/from Stand Sit to Stand: Mod assist;Min assist         General transfer comment: cues for hand placement/precautions; mod A initial stand from EOB and min A second trial from recliner; pt has difficulty with flexion at trunk/pelvis and tends to end up too  far to edge of seated surface; multimodal cues for increased trunk flexion especially when returing to seated position  Ambulation/Gait Ambulation/Gait assistance: +2 safety/equipment;Min guard;Min assist Gait Distance (Feet): 150 Feet Assistive device: Rolling walker (2 wheeled) Gait Pattern/deviations: Step-through pattern;Decreased stride length;Decreased step length - left;Decreased weight shift to right;Trunk flexed Gait velocity: Decreased   General Gait Details: cues for posture and forward gaze; pt demonstrates safer use of AD this session   Stairs Stairs: Yes Stairs assistance: Min guard;Min assist Stair Management: No rails;Step to pattern;Forwards;With cane Number of Stairs: 3 General stair comments: cues for sequencing with use of quad cane and for safety; min guard to ascend and min A to descend   Wheelchair Mobility    Modified Rankin (Stroke Patients Only)       Balance Overall balance assessment: Needs assistance Sitting-balance support: Feet supported Sitting balance-Leahy Scale: Fair Sitting balance - Comments: pt maintains posterior pelvic tilt in sitting and tends to have posterior lean although is unsupported    Standing balance support: Bilateral upper extremity supported;Single extremity supported;During functional activity Standing balance-Leahy Scale: Poor                              Cognition Arousal/Alertness: Awake/alert Behavior During Therapy: WFL for tasks assessed/performed Overall Cognitive Status: Within Functional Limits for tasks assessed                                 General Comments: pt with decreased insight to safety however suspect this is baseline as he climbed a ladder despite having  to use a cane for amb      Exercises      General Comments        Pertinent Vitals/Pain Pain Assessment: No/denies pain Pain Score: 0-No pain    Home Living                      Prior Function Level  of Independence: (needed help to donn socks and clip toenails)          PT Goals (current goals can now be found in the care plan section) Progress towards PT goals: Progressing toward goals    Frequency    Min 5X/week      PT Plan Current plan remains appropriate    Co-evaluation PT/OT/SLP Co-Evaluation/Treatment: Yes Reason for Co-Treatment: For patient/therapist safety;To address functional/ADL transfers PT goals addressed during session: Mobility/safety with mobility;Proper use of DME        AM-PAC PT "6 Clicks" Mobility   Outcome Measure  Help needed turning from your back to your side while in a flat bed without using bedrails?: A Lot Help needed moving from lying on your back to sitting on the side of a flat bed without using bedrails?: A Lot Help needed moving to and from a bed to a chair (including a wheelchair)?: A Lot Help needed standing up from a chair using your arms (e.g., wheelchair or bedside chair)?: A Little Help needed to walk in hospital room?: A Little Help needed climbing 3-5 steps with a railing? : A Little 6 Click Score: 15    End of Session Equipment Utilized During Treatment: Gait belt Activity Tolerance: Patient tolerated treatment well Patient left: in chair;with call bell/phone within reach;with chair alarm set Nurse Communication: Mobility status PT Visit Diagnosis: Unsteadiness on feet (R26.81);Muscle weakness (generalized) (M62.81);Difficulty in walking, not elsewhere classified (R26.2)     Time: [id number] PT Time Calculation (min) (ACUTE ONLY): 31 min  Charges:  $Gait Training: 8-22 mins                     Travis Hart, PTA Acute Rehabilitation Services Pager: [phone] Office: [phone]     Travis Hart 10/23/2018, 9:20 AM

## 2018-10-23 NOTE — Progress Notes (Signed)
  Speech Language Pathology Treatment: Dysphagia  Patient Details Name: Travis Hart MRN: [id number] DOB: 1950/08/13 Today's Date: 10/23/2018 Time: 0805-0827 SLP Time Calculation (min) (ACUTE ONLY): 22 min  Assessment / Plan / Recommendation Clinical Impression  Patient is planning for home discharge today. He tolerated trials of mechanical soft and thin liquids with no s/sx of aspiration. He demonstrated small bites, prolonged mastication and multiple swallows independently. Education provided for types of foods that are most difficult to swallow/chew enough for swallowing. Cautioned pt about fibrous meats and fresh breads.  Recommend a Mechanical soft diet with thin liquids for discharge home using the above aspiration precautions and compensatory techniques to reduce aspiration risk.    HPI HPI:  68 y.o. man s/p fall with neck pain and symptoms of instability with hand and numbness. CT C-spine confirmed fracture line through C6 vertebral body into the C5-6 disc space with fracture of the C6-7 posterior elements. Pt s/p C5-6 ACDF followed by C4-T1 posterior instrumented fusion on 7/5. PMH: arthritis, ankylosing spondylitis, freq hip dislocations.      SLP Plan  Continue with current plan of care(Pt will be discharging home today. Education completed)       Recommendations  Diet recommendations: Dysphagia 3 (mechanical soft);Thin liquid Liquids provided via: Cup;Straw Medication Administration: Crushed with puree Compensations: Slow rate;Small sips/bites;Multiple dry swallows after each bite/sip Postural Changes and/or Swallow Maneuvers: Seated upright 90 degrees;Upright 30-60 min after meal                Oral Care Recommendations: Oral care BID Plan: Continue with current plan of care(Pt will be discharging home today. Education completed)       [place], MA, CCC-SLP 10/23/2018 8:35 AM

## 2018-10-23 NOTE — Discharge Instructions (Signed)
Discharge Instructions  No restriction in activities except do not lift greater than 20 pounds until discussed at follow up. Slowly increase your activity back to normal, walking is a great form of rehab following spine surgery.  Your anterior neck incision is closed with dermabond (purple glue). This will naturally fall off over the next 1-2 weeks.   Okay to shower on the day of discharge. Use regular soap and water and try to be gentle when cleaning your incision.   Follow up with Dr. Zada Finders in 2 weeks after discharge. If you do not already have a discharge appointment, please call his office at [phone] to schedule a follow up appointment. If you have any concerns or questions, please call the office and let us know.

## 2018-10-23 NOTE — Progress Notes (Signed)
Occupational Therapy Treatment Patient Details Name: Travis Hart MRN: [id number] DOB: Sep 03, 1950 Today's Date: 10/23/2018    History of present illness  68 y.o. man s/p fall with neck pain and symptoms of instability with hand and numbness. CT C-spine confirmed fracture line through C6 vertebral body into the C5-6 disc space with fracture of the C6-7 posterior elements. Pt s/p C5-6 ACDF followed by C4-T1 posterior instrumented fusion on 7/5. PMH: arthritis, ankylosing spondylitis, freq hip dislocations.   OT comments  Pt. Seen for skilled OT/PT session for continued focus on safety with mobility and adl completion in preparation for home.  Pt. Able to complete log roll for bed mobility with min guard a. Shower stall transfer and toileting with min guard a.  Reports wife able to assist with LB adls prn but was eager to review a/e "just in case".  Refer to PT notes for ambulation and mobility details.  D/c home likely later today.    Follow Up Recommendations       Equipment Recommendations  None recommended by OT    Recommendations for Other Services      Precautions / Restrictions Precautions Precautions: Cervical Precaution Booklet Issued: Yes (comment) Precaution Comments: Verbally reviewed precautions during functional mobility.  Restrictions Weight Bearing Restrictions: No       Mobility Bed Mobility Overal bed mobility: Needs Assistance Bed Mobility: Rolling;Sidelying to Sit Rolling: Min guard Sidelying to sit: Min assist       General bed mobility comments: VC's for log roll technique; assist to elevate trunk from flat bed  Transfers Overall transfer level: Needs assistance Equipment used: Rolling walker (2 wheeled) Transfers: Sit to/from Stand Sit to Stand: Mod assist;Min assist         General transfer comment: cues for hand placement/precautions; mod A initial stand from EOB and min A second trial from recliner; pt has difficulty with flexion at  trunk/pelvis and tends to end up too far to edge of seated surface; multimodal cues for increased trunk flexion especially when returing to seated position    Balance Overall balance assessment: Needs assistance Sitting-balance support: Feet supported Sitting balance-Leahy Scale: Fair Sitting balance - Comments: pt maintains posterior pelvic tilt in sitting and tends to have posterior lean although is unsupported    Standing balance support: Bilateral upper extremity supported;Single extremity supported;During functional activity Standing balance-Leahy Scale: Poor                             ADL either performed or assessed with clinical judgement   ADL Overall ADL's : Needs assistance/impaired     Grooming: Wash/dry hands;Standing Grooming Details (indicate cue type and reason): cues for rw management while standing at the sink       Lower Body Bathing Details (indicate cue type and reason): unable to access kees distally -reviewed A/E as an option and showed all of the pieces, reports wife is available as needed     Lower Body Dressing: Moderate assistance;Sit to/from stand;With adaptive equipment Lower Body Dressing Details (indicate cue type and reason): able to reach RLE by crossing leg over knee to adj. sock and perform LB dressing but unable to reach LLE. reports he often does not wear underwear only shorts and never wears socks esp. in the summer. reports wife available to assist but also was interested in the sock aide wanted demo but declined return demo Toilet Transfer: Min guard;Cueing for sequencing;Ambulation;Comfort height toilet;RW Armed forces technical officer Details (  indicate cue type and reason): stood for urinating Toileting- ArchitectClothing Manipulation and Hygiene: Supervision/safety;Sit to/from stand   Tub/ Engineer, structuralhower Transfer: Walk-in shower;Shower seat;Ambulation;Anterior/posterior;Rolling walker Web designerTub/Shower Transfer Details (indicate cue type and reason): educated on how  to step over the ledge backwards for getting into shower stall Functional mobility during ADLs: Min guard;Rolling walker       Vision       Perception     Praxis      Cognition Arousal/Alertness: Awake/alert Behavior During Therapy: WFL for tasks assessed/performed Overall Cognitive Status: Within Functional Limits for tasks assessed                                 General Comments: pt with decreased insight to safety however suspect this is baseline as he climbed a ladder despite having to use a cane for amb        Exercises     Shoulder Instructions       General Comments      Pertinent Vitals/ Pain       Pain Assessment: No/denies pain Pain Score: 0-No pain  Home Living                                          Prior Functioning/Environment Level of Independence: (needed help to donn socks and clip toenails)            Frequency  Min 2X/week        Progress Toward Goals  OT Goals(current goals can now be found in the care plan section)  Progress towards OT goals: Progressing toward goals     Plan Discharge plan remains appropriate    Co-evaluation    PT/OT/SLP Co-Evaluation/Treatment: Yes Reason for Co-Treatment: To address functional/ADL transfers PT goals addressed during session: Mobility/safety with mobility;Proper use of DME OT goals addressed during session: ADL's and self-care      AM-PAC OT "6 Clicks" Daily Activity     Outcome Measure   Help from another person eating meals?: A Little Help from another person taking care of personal grooming?: A Little Help from another person toileting, which includes using toliet, bedpan, or urinal?: A Lot Help from another person bathing (including washing, rinsing, drying)?: A Lot Help from another person to put on and taking off regular upper body clothing?: A Lot Help from another person to put on and taking off regular lower body clothing?: Total 6 Click  Score: 13    End of Session Equipment Utilized During Treatment: Rolling walker;Gait belt  OT Visit Diagnosis: Unsteadiness on feet (R26.81);Muscle weakness (generalized) (M62.81);Pain Pain - part of body: Shoulder   Activity Tolerance Patient tolerated treatment well   Patient Left in chair;with call bell/phone within reach;with chair alarm set   Nurse Communication          Time: [id number] OT Time Calculation (min): 33 min  Charges: OT General Charges $OT Visit: 1 Visit OT Treatments $Self Care/Home Management : 8-22 mins  Robet LeuMorris, [place], COTA/L 10/23/2018, 9:32 AM

## 2018-10-23 NOTE — TOC Transition Note (Signed)
Transition of Care [facility]) - CM/SW Discharge Note   Patient Details  Name: Travis Hart MRN: [id number] Date of Birth: May 25, 1950  [facility] of [facility]) CM/SW Contact:  Pollie Friar, RN Phone Number: 10/23/2018, 10:32 AM   Clinical Narrative:    Pt discharging home with [facility] services. Cory with [facility] accepted the referral. Pt has needed DME at home. Pt's wife to provide transport home.   Final next level of care: Home w Home Health Services Barriers to Discharge: No Barriers Identified   Patient Goals and CMS Choice   CMS Medicare.gov Compare Post Acute Care list provided to:: Patient Choice offered to / list presented to : Patient  Discharge Placement                       Discharge Plan and Services   Discharge Planning Services: CM Consult Post Acute Care Choice: Home Health                    HH Arranged: PT, OT [facility] Agency: [place] Date [place]: 10/23/18 Time [place]: 3202 Representative spoke with at [place]: [place] ([place]) Interventions     Readmission Risk Interventions No flowsheet data found.

## 2018-10-23 NOTE — TOC Initial Note (Signed)
Transition of Care [facility]) - Initial/Assessment Note    Patient Details  Name: Travis Hart MRN: [id number] Date of Birth: Jul 10, 1950  Transition of Care [facility]) CM/SW Contact:    Pollie Friar, RN Phone Number: 10/23/2018, 10:32 AM  Clinical Narrative:                   Expected Discharge Plan: IP Rehab Facility Barriers to Discharge: No Barriers Identified   Patient Goals and CMS Choice   CMS Medicare.gov Compare Post Acute Care list provided to:: Patient Choice offered to / list presented to : Patient  Expected Discharge Plan and Services Expected Discharge Plan: [place]   Discharge Planning Services: CM Consult Post Acute Care Choice: [place] arrangements for the past 2 months: Single Family Home Expected Discharge Date: 10/23/18                         HH Arranged: PT, OT HH Agency: [place] Date [facility] Agency Contacted: 10/23/18 Time [place]: 1740 Representative spoke with at [place]: Tommi Rumps  Prior Living Arrangements/Services Living arrangements for the past 2 months: [place] with:: Spouse Patient language and need for interpreter reviewed:: Yes(no needs) Do you feel safe going back to the place where you live?: Yes      Need for Family Participation in Patient Care: Yes (Comment) Care giver support system in place?: Yes (comment)(wife is able to provide needed supervision and support) Current home services: DME(cane and walker) Criminal Activity/Legal Involvement Pertinent to Current Situation/Hospitalization: No - Comment as needed  Activities of Daily Living Home Assistive Devices/Equipment: Cane (specify quad or straight) ADL Screening (condition at time of admission) Patient's cognitive ability adequate to safely complete daily activities?: Yes Is the patient deaf or have difficulty hearing?: No Does the patient have difficulty seeing, even when wearing glasses/contacts?: No Does the  patient have difficulty concentrating, remembering, or making decisions?: No Patient able to express need for assistance with ADLs?: Yes Does the patient have difficulty dressing or bathing?: No Independently performs ADLs?: Yes (appropriate for developmental age) Does the patient have difficulty walking or climbing stairs?: No Weakness of Legs: None Weakness of Arms/Hands: None  Permission Sought/Granted                  Emotional Assessment Appearance:: Appears stated age Attitude/Demeanor/Rapport: Engaged Affect (typically observed): Accepting, Pleasant Orientation: : Oriented to Self, Oriented to Place, Oriented to  Time, Oriented to Situation   Psych Involvement: No (comment)  Admission diagnosis:  Neck pain [M54.2] Closed cervical spine fracture ([place]) [S12.9XXA] Patient Active Problem List   Diagnosis Date Noted  . Closed cervical spine fracture ([place]) 10/18/2018   PCP:  Virl Son., MD Pharmacy:   [id number] - [place], [place] - [address] [place] [address] Phone: [phone] Fax: [phone]     Social Determinants of Health (SDOH) Interventions    Readmission Risk Interventions No flowsheet data found.

## 2018-10-23 NOTE — Progress Notes (Signed)
Neurosurgery Service Progress Note  Subjective: No acute events overnight, dysphagia improving, hand numbness/function improving  Objective: Vitals:   10/22/18 1558 10/22/18 2002 10/22/18 2328 10/23/18 0335  BP: 131/69 (!) 142/69 139/68 (!) 144/75  Pulse: 67 68 62 60  Resp: 15 17 17 17   Temp: 98 F (36.7 C)  (!) 97.5 F (36.4 C) 97.7 F (36.5 C)  TempSrc: Oral  Oral Oral  SpO2: 94% 94% 91% 95%  Weight:      Height:       Temp (24hrs), Avg:97.7 F (36.5 C), Min:97.5 F (36.4 C), Max:98 F (36.7 C)  CBC Latest Ref Rng & Units 10/18/2018 10/18/2018 10/17/2018  WBC 4.0 - 10.5 K/uL 12.3(H) - 11.1(H)  Hemoglobin 13.0 - 17.0 g/dL 12.1(L) 12.6(L) 14.7  Hematocrit 39.0 - 52.0 % 36.2(L) 37.0(L) 43.5  Platelets 150 - 400 K/uL 198 - 198   BMP Latest Ref Rng & Units 10/18/2018 10/17/2018  Glucose 70 - 99 mg/dL 130(H) 107(H)  BUN 8 - 23 mg/dL - 15  Creatinine 0.61 - 1.24 mg/dL 1.08 0.81  Sodium 135 - 145 mmol/L 137 136  Potassium 3.5 - 5.1 mmol/L 4.0 4.0  Chloride 98 - 111 mmol/L - 101  CO2 22 - 32 mmol/L - 26  Calcium 8.9 - 10.3 mg/dL - 9.3    Intake/Output Summary (Last 24 hours) at 10/23/2018 0810 Last data filed at 10/23/2018 0300 Gross per 24 hour  Intake 582.5 ml  Output 475 ml  Net 107.5 ml    Current Facility-Administered Medications:  .  0.9 %  sodium chloride infusion, , Intravenous, Continuous, Charvis Lightner, Joyice Faster, MD, Last Rate: 75 mL/hr at 10/23/18 0757 .  acetaminophen (TYLENOL) tablet 650 mg, 650 mg, Oral, Q4H PRN **OR** acetaminophen (TYLENOL) suppository 650 mg, 650 mg, Rectal, Q4H PRN, Kasean Denherder A, MD .  dexamethasone (DECADRON) injection 4 mg, 4 mg, Intravenous, Q6H, Captain Blucher, Joyice Faster, MD, 4 mg at 10/23/18 0527 .  docusate sodium (COLACE) capsule 100 mg, 100 mg, Oral, BID, Amalya Salmons A, MD, 100 mg at 10/22/18 2310 .  famotidine (PEPCID) tablet 20 mg, 20 mg, Oral, Daily, Owynn Mosqueda A, MD, 20 mg at 10/22/18 0849 .  heparin injection 5,000 Units,  5,000 Units, Subcutaneous, Q8H, Judith Part, MD, 5,000 Units at 10/23/18 0526 .  HYDROmorphone (DILAUDID) injection 0.5 mg, 0.5 mg, Intravenous, Q3H PRN, Judith Part, MD, 0.5 mg at 10/21/18 2222 .  insulin aspart (novoLOG) injection 0-9 Units, 0-9 Units, Subcutaneous, TID WC, Judith Part, MD, 1 Units at 10/23/18 0755 .  lactated ringers infusion, , Intravenous, Continuous, Lillia Abed, MD, Last Rate: 10 mL/hr at 10/18/18 1201 .  MEDLINE mouth rinse, 15 mL, Mouth Rinse, BID, Hiromi Knodel, Joyice Faster, MD, 15 mL at 10/22/18 0849 .  ondansetron (ZOFRAN) tablet 4 mg, 4 mg, Oral, Q4H PRN **OR** ondansetron (ZOFRAN) injection 4 mg, 4 mg, Intravenous, Q4H PRN, Swayze Pries A, MD .  oxyCODONE (Oxy IR/ROXICODONE) immediate release tablet 10 mg, 10 mg, Oral, Q4H PRN, Judith Part, MD, 10 mg at 10/20/18 0347 .  oxyCODONE (Oxy IR/ROXICODONE) immediate release tablet 5 mg, 5 mg, Oral, Q4H PRN, Judith Part, MD, 5 mg at 10/20/18 1610 .  polyethylene glycol (MIRALAX / GLYCOLAX) packet 17 g, 17 g, Oral, Daily PRN, Judith Part, MD, 17 g at 10/19/18 2142 .  promethazine (PHENERGAN) tablet 12.5-25 mg, 12.5-25 mg, Oral, Q4H PRN, Willma Obando, Joyice Faster, MD   Physical Exam: AOx3, PERRL, EOMI, FS, Strength 4+/5 in  BUE and BLE, SILTx4 except for mild hand numbness, no drift  Incisions c/d/i  Assessment & Plan: 68 y.o. man with AS s/p 3 column unstable cervical spine fracture s/p anterior cervical corpectomy and posterior instrumented fusion, recovering well. Post-op XR show hardware intact, good alignment  Neuro: -exam improving -activity as tolerated, no collar needed  Cardiopulm: no issues  FENGI - post-op dysphagia -dex IV 4q6 x2 more days, insulin sliding scale, switch to po H2 blocker -s/p MBS, was on liquids, but being seen by SLP right now and feels like his swallowing is much better  Heme/ID no issues  PPx/Dispo -SCDs/TEDs, SQH  -PT/OT recommending CIR, no  bed available, pt wants to do rehab at home -discharge w/ home PT/OT -rpt SLP eval to clear diet recs before discharge -discharge home today  Jadene Pierinihomas A Vanden Fawaz  10/23/18 8:10 AM

## 2018-10-23 NOTE — Discharge Summary (Signed)
Discharge Summary  Date of Admission: 10/17/2018  Date of Discharge: 10/23/18  Attending Physician: Emelda Brothers, MD  Hospital Course: Patient presented to the ED with severe neck pain and bilateral hand numbness and weakness after a fall off a ladder. An MRI and CT confirmed a three column unstable fracture distraction fracture in his cervical spine, which is likely related to his ankylosing spondylitis. He was taken to the OR on 10/18/18 for a C6 corpectomy, C5-7 anterior instrumented fusion, then posterior C4-T1 instrumented fusion. He was recovered in PACU and transferred to 3W. He had some post-operative dysphagia from his corpectomy approach that rapidly improved with steroids. His diet was advanced day by day. His hospital course was uncomplicated and the patient was discharged home with home PT on 10/23/2018. He will follow up in clinic with me in 2 weeks.  Neurologic exam at discharge:  AOx3, PERRL, EOMI, FS, TM Strength 4+/5 in BUE, 5/5 in BLE, SILTx4 except for improving bilateral hand numbness   Discharge diagnosis: unstable closed fracture of the cervical spine  Judith Part, MD 10/23/18 8:15 AM

## 2018-10-23 NOTE — Progress Notes (Signed)
Inpatient Rehabilitation Admissions Coordinator  I met with patient ay bedside. He states he prefers d/c home today. I have updated RN CM. We will sign off at this time.  Danne Baxter, RN, MSN Rehab Admissions Coordinator [phone] 10/23/2018 10:01 AM

## 2020-02-16 IMAGING — DX CERVICAL SPINE - 2-3 VIEW
4 series · 4 of 4 positions shown · non-contrast
Comparison: Intraoperative fluoroscopy radiograph dated 10/18/2018
and cervical spine CT dated 10/18/2018

CLINICAL DATA: 68-year-old male with C-spine fracture status post
cervical corpectomy and fusion.

EXAM:
CERVICAL SPINE - 2-3 VIEW

[c-spine lat]
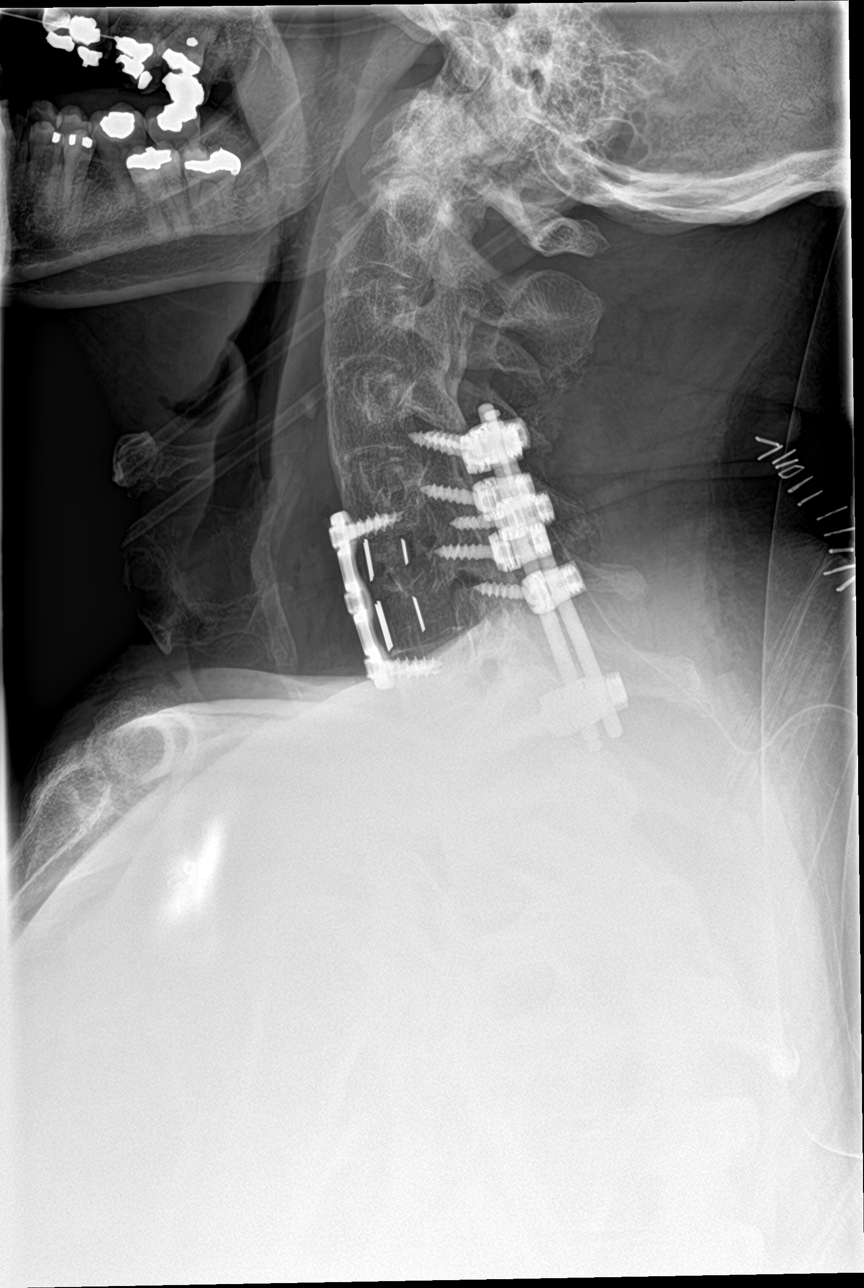

[c-spine ap]
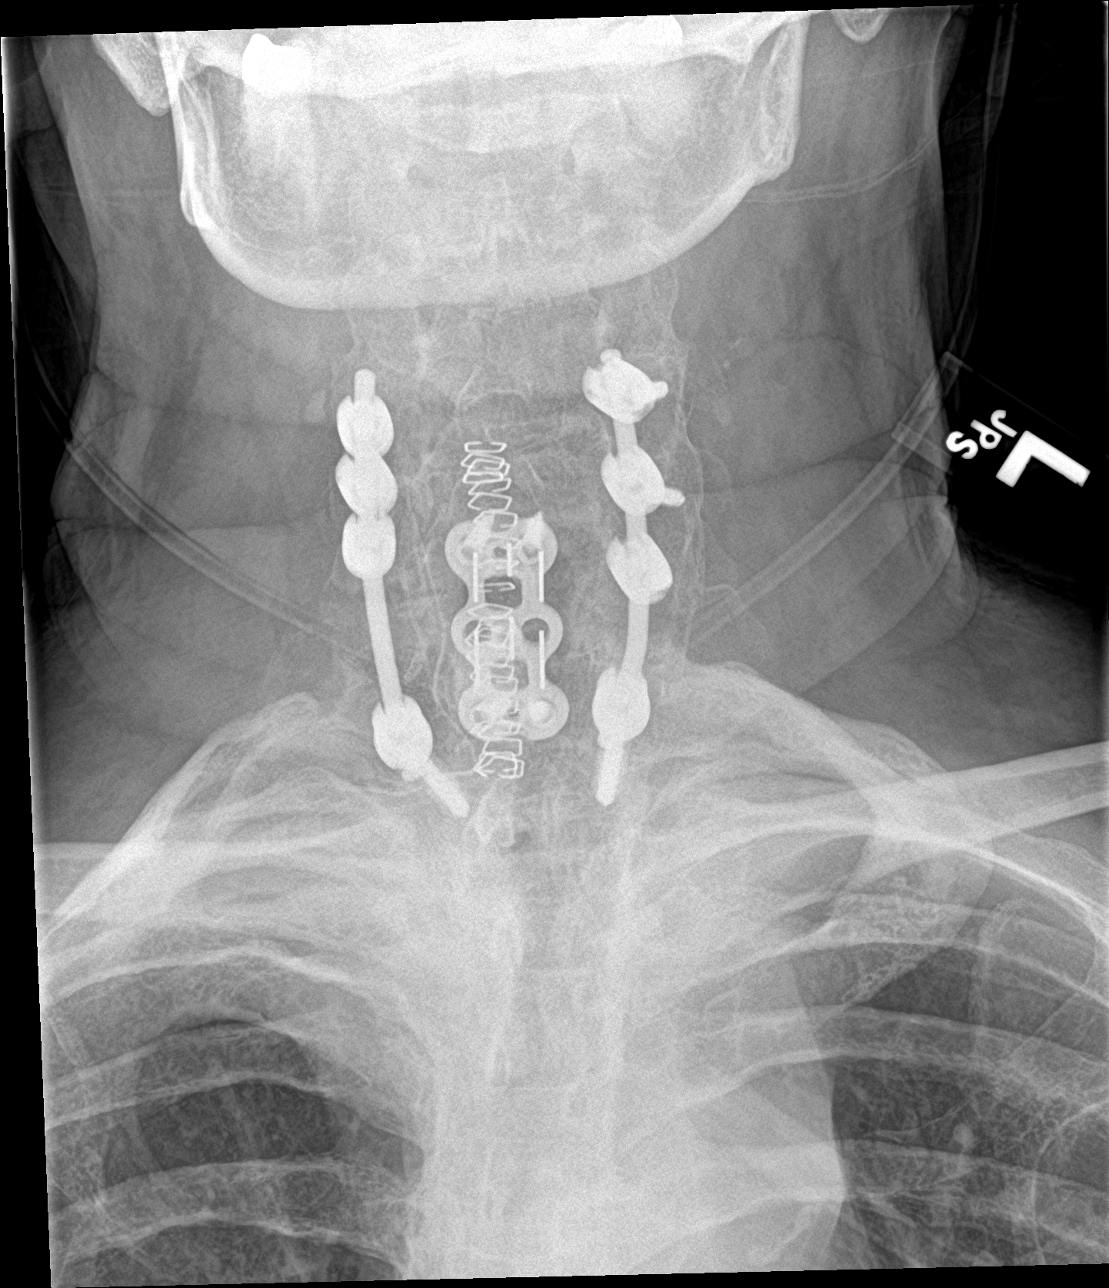

[c-spine swimmers (1 of 2)]
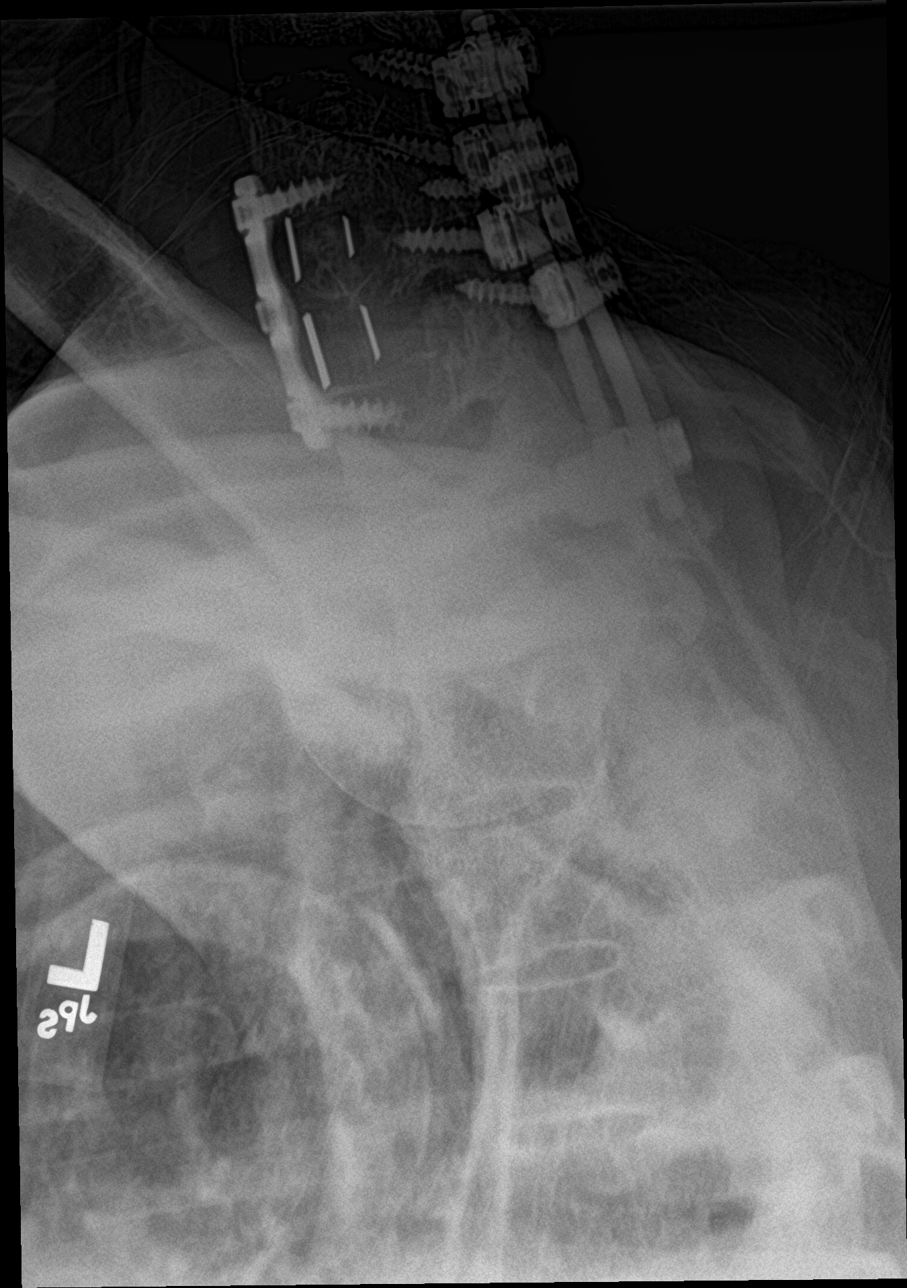

[c-spine swimmers (2 of 2)]
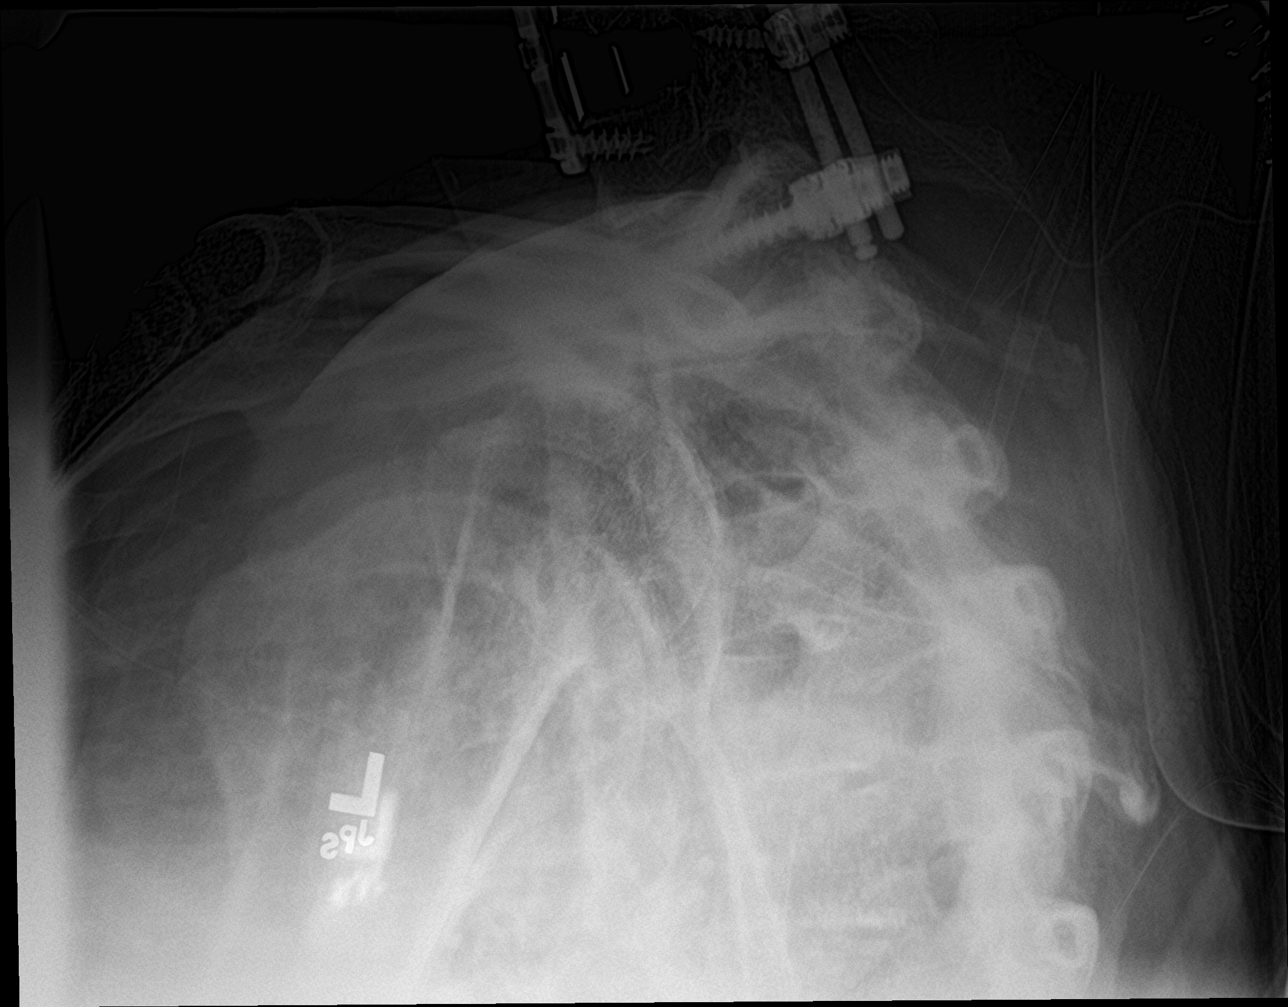

[4 of 4 positions shown; findings below may reference images not displayed]

FINDINGS: There is anterior fusion plate and screws at C5-T1 with corpectomy
at C6-C7. Posterior fusion hardware extending from the level of C[DATE] appears to be T2. The orthopedic hardware appear intact. The
cervical alignment is preserved. There is advanced osteopenia which
limits evaluation for fracture. Diffuse osseous ankylosis noted.
Mild thickening of the anterior paravertebral space distal to the
level of C5 likely combination of edema and added soft tissue
related to esophagus. This is similar to prior CT.
IMPRESSION: 1. Postoperative changes as described.
2. No subluxation.
3. Osteopenia and ankylosis.

## 2020-03-21 ENCOUNTER — Other Ambulatory Visit (HOSPITAL_COMMUNITY): Payer: Self-pay

## 2020-03-25 ENCOUNTER — Encounter: Payer: Self-pay | Admitting: Nurse Practitioner

## 2020-03-25 ENCOUNTER — Other Ambulatory Visit: Payer: Self-pay | Admitting: Nurse Practitioner

## 2020-03-25 DIAGNOSIS — R55 Syncope and collapse: Secondary | ICD-10-CM | POA: Insufficient documentation

## 2020-03-26 ENCOUNTER — Other Ambulatory Visit: Payer: Self-pay | Admitting: Physician Assistant

## 2020-03-26 DIAGNOSIS — J449 Chronic obstructive pulmonary disease, unspecified: Secondary | ICD-10-CM

## 2020-03-26 DIAGNOSIS — Z6832 Body mass index (BMI) 32.0-32.9, adult: Secondary | ICD-10-CM

## 2020-03-26 DIAGNOSIS — M450 Ankylosing spondylitis of multiple sites in spine: Secondary | ICD-10-CM

## 2020-03-26 DIAGNOSIS — U071 COVID-19: Secondary | ICD-10-CM

## 2020-03-26 NOTE — Progress Notes (Signed)
I connected by phone with Travis Hart on 03/26/2020 at 8:35 AM to discuss the potential use of a new treatment for mild to moderate COVID-19 viral infection in non-hospitalized patients.  This patient is a 69 y.o. male that meets the FDA criteria for Emergency Use Authorization of COVID monoclonal antibody sotrovimab, casirivimab/imdevimab or bamlamivimab/estevimab.  Has a (+) direct SARS-CoV-2 viral test result  Has mild or moderate COVID-19   Is NOT hospitalized due to COVID-19  Is within 10 days of symptom onset  Has at least one of the high risk factor(s) for progression to severe COVID-19 and/or hospitalization as defined in EUA.  Specific high risk criteria : Older age (>/= 67 yo) and BMI > 25, COPD, ankylosing spondylitis, high SVI.    I have spoken and communicated the following to the patient or parent/caregiver regarding COVID monoclonal antibody treatment:  1. FDA has authorized the emergency use for the treatment of mild to moderate COVID-19 in adults and pediatric patients with positive results of direct SARS-CoV-2 viral testing who are 32 years of age and older weighing at least 40 kg, and who are at high risk for progressing to severe COVID-19 and/or hospitalization.  2. The significant known and potential risks and benefits of COVID monoclonal antibody, and the extent to which such potential risks and benefits are unknown.  3. Information on available alternative treatments and the risks and benefits of those alternatives, including clinical trials.  4. Patients treated with COVID monoclonal antibody should continue to self-isolate and use infection control measures (e.g., wear mask, isolate, social distance, avoid sharing personal items, clean and disinfect "high touch" surfaces, and frequent handwashing) according to CDC guidelines.   5. The patient or parent/caregiver has the option to accept or refuse COVID monoclonal antibody treatment.  After reviewing this  information with the patient, the patient has agreed to receive one of the available covid 19 monoclonal antibodies and will be provided an appropriate fact sheet prior to infusion.  Sx onset 12/6. Set up for infusion on 12/13 @ 2:30pm. Directions given to [facility]. Pt is aware that insurance will be charged an infusion fee. Pt is unvaccinated. Tested at [facility].   [name] 03/26/2020 8:35 AM

## 2020-03-27 ENCOUNTER — Other Ambulatory Visit (HOSPITAL_COMMUNITY): Payer: Self-pay

## 2020-03-27 ENCOUNTER — Ambulatory Visit (HOSPITAL_COMMUNITY)
Admission: RE | Admit: 2020-03-27 | Discharge: 2020-03-27 | Disposition: A | Discharge status: Home / Self Care | Payer: Medicare Other | Source: Ambulatory Visit | Attending: Pulmonary Disease | Admitting: Pulmonary Disease

## 2020-03-27 DIAGNOSIS — M450 Ankylosing spondylitis of multiple sites in spine: Secondary | ICD-10-CM | POA: Diagnosis present

## 2020-03-27 DIAGNOSIS — U071 COVID-19: Secondary | ICD-10-CM | POA: Insufficient documentation

## 2020-03-27 DIAGNOSIS — J449 Chronic obstructive pulmonary disease, unspecified: Secondary | ICD-10-CM | POA: Diagnosis present

## 2020-03-27 DIAGNOSIS — Z23 Encounter for immunization: Secondary | ICD-10-CM | POA: Diagnosis not present

## 2020-03-27 DIAGNOSIS — Z6832 Body mass index (BMI) 32.0-32.9, adult: Secondary | ICD-10-CM | POA: Insufficient documentation

## 2020-03-27 MED ORDER — SODIUM CHLORIDE 0.9 % IV SOLN: INTRAVENOUS | Status: DC | PRN

## 2020-03-27 MED ORDER — EPINEPHRINE 0.3 MG/0.3ML IJ SOAJ: 0.3000 mg | Freq: Once | INTRAMUSCULAR | Status: DC | PRN

## 2020-03-27 MED ORDER — DIPHENHYDRAMINE HCL 50 MG/ML IJ SOLN: 50.0000 mg | Freq: Once | INTRAMUSCULAR | Status: DC | PRN

## 2020-03-27 MED ORDER — METHYLPREDNISOLONE SODIUM SUCC 125 MG IJ SOLR: 125.0000 mg | Freq: Once | INTRAMUSCULAR | Status: DC | PRN

## 2020-03-27 MED ORDER — ALBUTEROL SULFATE HFA 108 (90 BASE) MCG/ACT IN AERS: 2.0000 | INHALATION_SPRAY | Freq: Once | RESPIRATORY_TRACT | Status: DC | PRN

## 2020-03-27 MED ORDER — FAMOTIDINE IN NACL 20-0.9 MG/50ML-% IV SOLN: 20.0000 mg | Freq: Once | INTRAVENOUS | Status: DC | PRN

## 2020-03-27 MED ORDER — SODIUM CHLORIDE 0.9 % IV SOLN: Freq: Once | INTRAVENOUS | Status: AC

## 2020-03-27 NOTE — Progress Notes (Signed)
  Diagnosis: COVID-19  Physician: Dr. Wright   Procedure: Covid Infusion Clinic Med: bamlanivimab\etesevimab infusion - Provided patient with bamlanimivab\etesevimab fact sheet for patients, parents and caregivers prior to infusion.  Complications: No immediate complications noted.  Discharge: Discharged home   Travis Hart  [name] 03/27/2020  

## 2020-03-27 NOTE — Discharge Instructions (Signed)
10 Things You Can Do to Manage Your COVID-19 Symptoms at Home If you have possible or confirmed COVID-19: 1. Stay home from work and school. And stay away from other public places. If you must go out, avoid using any kind of public transportation, ridesharing, or taxis. 2. Monitor your symptoms carefully. If your symptoms get worse, call your healthcare provider immediately. 3. Get rest and stay hydrated. 4. If you have a medical appointment, call the healthcare provider ahead of time and tell them that you have or may have COVID-19. 5. For medical emergencies, call 911 and notify the dispatch personnel that you have or may have COVID-19. 6. Cover your cough and sneezes with a tissue or use the inside of your elbow. 7. Wash your hands often with soap and water for at least 20 seconds or clean your hands with an alcohol-based hand sanitizer that contains at least 60% alcohol. 8. As much as possible, stay in a specific room and away from other people in your home. Also, you should use a separate bathroom, if available. If you need to be around other people in or outside of the home, wear a mask. 9. Avoid sharing personal items with other people in your household, like dishes, towels, and bedding. 10. Clean all surfaces that are touched often, like counters, tabletops, and doorknobs. Use household cleaning sprays or wipes according to the label instructions. cdc.gov/coronavirus 10/14/2018 This information is not intended to replace advice given to you by your health care provider. Make sure you discuss any questions you have with your health care provider. Document Revised: 03/18/2019 Document Reviewed: 03/18/2019 Elsevier Patient Education  2020 [employer] What types of side effects do monoclonal antibody drugs cause?  Common side effects  In general, the more common side effects caused by monoclonal antibody drugs include: . Allergic reactions, such as hives or itching . Flu-like signs and  symptoms, including chills, fatigue, fever, and muscle aches and pains . Nausea, vomiting . Diarrhea . Skin rashes . Low blood pressure   The CDC is recommending patients who receive monoclonal antibody treatments wait at least 90 days before being vaccinated.  Currently, there are no data on the safety and efficacy of mRNA COVID-19 vaccines in persons who received monoclonal antibodies or convalescent plasma as part of COVID-19 treatment. Based on the estimated half-life of such therapies as well as evidence suggesting that reinfection is uncommon in the 90 days after initial infection, vaccination should be deferred for at least 90 days, as a precautionary measure until additional information becomes available, to avoid interference of the antibody treatment with vaccine-induced immune responses. If you have any questions or concerns after the infusion please call the Advanced Practice Provider on call at [phone]. This number is ONLY intended for your use regarding questions or concerns about the infusion post-treatment side-effects.  Please do not provide this number to others for use. For return to work notes please contact your primary care provider.   If someone you know is interested in receiving treatment please have them call the COVID hotline at [phone].   

## 2020-03-27 NOTE — Progress Notes (Signed)
Patient reviewed Fact Sheet for Patients, Parents, and Caregivers for Emergency Use Authorization (EUA) of [facility] for the Treatment of Coronavirus. Patient also reviewed and is agreeable to the estimated cost of treatment. Patient is agreeable to proceed.
